# Patient Record
Sex: Female | Born: 1935
Health system: Southern US, Community
[De-identification: ages and names within clinical notes are randomized; demographics above are authoritative.]

## PROBLEM LIST (undated history)

## (undated) DIAGNOSIS — Z87442 Personal history of urinary calculi: Secondary | ICD-10-CM

## (undated) DIAGNOSIS — C50919 Malignant neoplasm of unspecified site of unspecified female breast: Secondary | ICD-10-CM

## (undated) DIAGNOSIS — M1990 Unspecified osteoarthritis, unspecified site: Secondary | ICD-10-CM

## (undated) DIAGNOSIS — N200 Calculus of kidney: Secondary | ICD-10-CM

## (undated) HISTORY — PX: CATARACT EXTRACTION, BILATERAL: SHX1313

## (undated) HISTORY — PX: ABDOMINAL HYSTERECTOMY: SHX81

## (undated) HISTORY — PX: BREAST SURGERY: SHX581

## (undated) HISTORY — PX: TOTAL ABDOMINAL HYSTERECTOMY W/ BILATERAL SALPINGOOPHORECTOMY: SHX83

## (undated) HISTORY — DX: Calculus of kidney: N20.0

## (undated) HISTORY — PX: WRIST FRACTURE SURGERY: SHX121

## (undated) HISTORY — PX: KIDNEY STONE SURGERY: SHX686

## (undated) HISTORY — PX: APPENDECTOMY: SHX54

## (undated) HISTORY — DX: Malignant neoplasm of unspecified site of unspecified female breast: C50.919

---

## 2002-03-10 ENCOUNTER — Other Ambulatory Visit: Admission: RE | Admit: 2002-03-10 | Discharge: 2002-03-10 | Payer: Self-pay | Admitting: Obstetrics and Gynecology

## 2004-12-18 ENCOUNTER — Other Ambulatory Visit: Admission: RE | Admit: 2004-12-18 | Discharge: 2004-12-18 | Payer: Self-pay | Admitting: Family Medicine

## 2010-05-04 ENCOUNTER — Encounter: Payer: Self-pay | Admitting: Emergency Medicine

## 2010-05-04 ENCOUNTER — Ambulatory Visit (HOSPITAL_COMMUNITY): Admission: RE | Admit: 2010-05-04 | Discharge: 2010-05-04 | Payer: Self-pay | Admitting: Urology

## 2010-05-08 ENCOUNTER — Ambulatory Visit (HOSPITAL_COMMUNITY): Admission: RE | Admit: 2010-05-08 | Discharge: 2010-05-08 | Payer: Self-pay | Admitting: Urology

## 2010-11-05 ENCOUNTER — Encounter: Payer: Self-pay | Admitting: Family Medicine

## 2010-12-30 LAB — CBC
MCHC: 34 g/dL (ref 30.0–36.0)
RDW: 13.5 % (ref 11.5–15.5)

## 2010-12-30 LAB — URINE MICROSCOPIC-ADD ON

## 2010-12-30 LAB — DIFFERENTIAL
Basophils Absolute: 0.1 10*3/uL (ref 0.0–0.1)
Lymphocytes Relative: 6 % — ABNORMAL LOW (ref 12–46)
Lymphs Abs: 0.7 10*3/uL (ref 0.7–4.0)
Monocytes Absolute: 0.6 10*3/uL (ref 0.1–1.0)
Neutro Abs: 9.2 10*3/uL — ABNORMAL HIGH (ref 1.7–7.7)
Neutrophils Relative %: 87 % — ABNORMAL HIGH (ref 43–77)

## 2010-12-30 LAB — URINALYSIS, ROUTINE W REFLEX MICROSCOPIC
Glucose, UA: NEGATIVE mg/dL
Nitrite: NEGATIVE
Protein, ur: NEGATIVE mg/dL
Urobilinogen, UA: 0.2 mg/dL (ref 0.0–1.0)

## 2010-12-30 LAB — URINE CULTURE: Culture: NO GROWTH

## 2010-12-30 LAB — COMPREHENSIVE METABOLIC PANEL
Albumin: 3.9 g/dL (ref 3.5–5.2)
BUN: 16 mg/dL (ref 6–23)
CO2: 27 mEq/L (ref 19–32)
GFR calc Af Amer: >60 mL/min (ref >60)
Glucose, Bld: 121 mg/dL — ABNORMAL HIGH (ref 70–99)
Sodium: 137 mEq/L (ref 135–145)

## 2010-12-30 LAB — SURGICAL PCR SCREEN
MRSA, PCR: NEGATIVE
Staphylococcus aureus: NEGATIVE

## 2016-01-14 DIAGNOSIS — C50919 Malignant neoplasm of unspecified site of unspecified female breast: Secondary | ICD-10-CM

## 2016-01-14 HISTORY — DX: Malignant neoplasm of unspecified site of unspecified female breast: C50.919

## 2016-01-16 DIAGNOSIS — L570 Actinic keratosis: Secondary | ICD-10-CM | POA: Diagnosis not present

## 2016-01-16 DIAGNOSIS — L989 Disorder of the skin and subcutaneous tissue, unspecified: Secondary | ICD-10-CM | POA: Diagnosis not present

## 2016-01-16 DIAGNOSIS — D485 Neoplasm of uncertain behavior of skin: Secondary | ICD-10-CM | POA: Diagnosis not present

## 2016-01-23 ENCOUNTER — Encounter: Payer: Self-pay | Admitting: Oncology

## 2016-01-23 ENCOUNTER — Telehealth: Payer: Self-pay | Admitting: Oncology

## 2016-01-23 NOTE — Telephone Encounter (Signed)
Spoke with patient re new patient appointment with GM 02/02/2016 @ 4 pm to arrive 3:30 pm. Patient demographic and insurance info confirmed. Letters sent.

## 2016-02-01 ENCOUNTER — Other Ambulatory Visit: Payer: Self-pay

## 2016-02-02 ENCOUNTER — Ambulatory Visit (HOSPITAL_BASED_OUTPATIENT_CLINIC_OR_DEPARTMENT_OTHER): Payer: Medicare Other | Admitting: Oncology

## 2016-02-02 ENCOUNTER — Other Ambulatory Visit (HOSPITAL_BASED_OUTPATIENT_CLINIC_OR_DEPARTMENT_OTHER): Payer: Medicare Other

## 2016-02-02 ENCOUNTER — Encounter: Payer: Self-pay | Admitting: Oncology

## 2016-02-02 ENCOUNTER — Other Ambulatory Visit: Payer: Self-pay

## 2016-02-02 VITALS — BP 144/84 | HR 80 | Temp 97.8°F | Resp 18 | Ht 62.5 in | Wt 162.3 lb

## 2016-02-02 DIAGNOSIS — Z170 Estrogen receptor positive status [ER+]: Secondary | ICD-10-CM

## 2016-02-02 DIAGNOSIS — C50919 Malignant neoplasm of unspecified site of unspecified female breast: Secondary | ICD-10-CM

## 2016-02-02 DIAGNOSIS — C50911 Malignant neoplasm of unspecified site of right female breast: Secondary | ICD-10-CM | POA: Diagnosis not present

## 2016-02-02 DIAGNOSIS — C50311 Malignant neoplasm of lower-inner quadrant of right female breast: Secondary | ICD-10-CM | POA: Insufficient documentation

## 2016-02-02 LAB — CBC WITH DIFFERENTIAL/PLATELET
BASO%: 0.9 % (ref 0.0–2.0)
BASOS ABS: 0.1 10*3/uL (ref 0.0–0.1)
EOS ABS: 0.1 10*3/uL (ref 0.0–0.5)
EOS%: 1.2 % (ref 0.0–7.0)
HEMATOCRIT: 45.3 % (ref 34.8–46.6)
HGB: 14.7 g/dL (ref 11.6–15.9)
LYMPH#: 1.3 10*3/uL (ref 0.9–3.3)
LYMPH%: 16.3 % (ref 14.0–49.7)
MCH: 28.3 pg (ref 25.1–34.0)
MCHC: 32.5 g/dL (ref 31.5–36.0)
MCV: 87.1 fL (ref 79.5–101.0)
MONO#: 0.5 10*3/uL (ref 0.1–0.9)
MONO%: 6.1 % (ref 0.0–14.0)
NEUT#: 5.9 10*3/uL (ref 1.5–6.5)
NEUT%: 75.5 % (ref 38.4–76.8)
PLATELETS: 219 10*3/uL (ref 145–400)
RBC: 5.2 10*6/uL (ref 3.70–5.45)
RDW: 13.6 % (ref 11.2–14.5)
WBC: 7.8 10*3/uL (ref 3.9–10.3)

## 2016-02-02 LAB — COMPREHENSIVE METABOLIC PANEL
ALT: 16 U/L (ref 0–55)
ANION GAP: 10 meq/L (ref 3–11)
AST: 17 U/L (ref 5–34)
Albumin: 3.8 g/dL (ref 3.5–5.0)
Alkaline Phosphatase: 105 U/L (ref 40–150)
BILIRUBIN TOTAL: 0.32 mg/dL (ref 0.20–1.20)
BUN: 18.2 mg/dL (ref 7.0–26.0)
CALCIUM: 9.4 mg/dL (ref 8.4–10.4)
CHLORIDE: 105 meq/L (ref 98–109)
CO2: 28 mEq/L (ref 22–29)
CREATININE: 0.9 mg/dL (ref 0.6–1.1)
EGFR: 63 mL/min/{1.73_m2} — ABNORMAL LOW (ref >90)
Glucose: 99 mg/dl (ref 70–140)
Potassium: 3.9 mEq/L (ref 3.5–5.1)
Sodium: 143 mEq/L (ref 136–145)
Total Protein: 7.6 g/dL (ref 6.4–8.3)

## 2016-02-02 LAB — LACTATE DEHYDROGENASE: LDH: 189 U/L (ref 125–245)

## 2016-02-02 MED ORDER — ANASTROZOLE 1 MG PO TABS: 1.0000 mg | ORAL_TABLET | Freq: Every day | ORAL | Status: DC

## 2016-02-02 NOTE — Progress Notes (Signed)
Stable to improved status was results and this is Coumadin adjusted shock [facility]  [phone] [phone]     ID: Jessica [name] DOB: 05/10/1935  MR#: [id number]  CSN#:[id number]  Patient Care Team: Gustav C Magrinat, MD as Consulting Physician (Oncology) Stuart Tafeen, MD as Consulting Physician (Dermatology) PCP: No primary care provider on file. GYN: SU:  OTHER MD:  CHIEF COMPLAINT: right-sided breast cancer  CURRENT TREATMENT: staging evaluation in progress   BREAST CANCER HISTORY: "Jessica [name]" was cutting some roses in her garden for someone's birthday when she scratched herself. In the process of dealing with that she discovered a spot under her right breast. She called [facility] and was evaluated by Dr. Tafeen 01/16/2016. He noted among other lesions of benign nature and area under the right breast which appeared inflamed and indented. Biopsy was obtained and showed ([id number]) metastatic carcinoma, which was estrogen and progesterone receptor positive. The lesion extended to the margins of the biopsy.  The patient's subsequent history is as detailed below.  INTERVAL HISTORY: Jessica [name] was evaluated in the breast clinic 02/02/2016 accompanied for the last part of her visit by her son Jessica [name].  REVIEW OF SYSTEMS:  The patient denies unusual headaches, visual changes, nausea, vomiting, dizziness, gait imbalance, or falls. She denies cough, phlegm production, pleurisy, or shortness of breath. She denies any heart problems, chest pain or pressure. She denies any change in bowel or bladder habits. She is not aware of any other skin areas or breast areas of concern. She denies pain, unexplained fatigue, unexplained weight loss, fever, rash, or bleeding. She walks about 30 minutes a day and gardens for exercise. A detailed review of systems today was otherwise negative.  PAST MEDICAL HISTORY: Past Medical History  Diagnosis Date  . Breast  cancer (HCC)   . Nephrolithiasis     PAST SURGICAL HISTORY: Past Surgical History  Procedure Laterality Date  . Total abdominal hysterectomy w/ bilateral salpingoophorectomy    . Appendectomy    . Cataract extraction, bilateral      FAMILY HISTORY No family history on file.  The patient's father died at the age of 68 from a heart attack in the setting of tobacco abuse. The patient's mother died at the age of 54 from suicide. The patient had 6 brothers, 5 sisters. 5 brothers are dead. 2 committed suicide. One was shot. One had an automobile accident, and one died in a house fire. Of the patient's 5 sisters one died with Lyme disease. There is no breast or ovarian cancer in the family to the patient's knowledge  GYNECOLOGIC HISTORY:  No LMP recorded.  menarche age 15 first live birth age 21 the patient is GX P3. She underwent hysterectomy with bilateral salpingo-oophorectomy about 50 years ago" and took hormone replacement "about 20 years".    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 Her  Husband Jessica [name] died 27 years ago from lung cancer and complications of smoking and alcohol. The patient lives alone with her dachshund.  Her children are Jessica [name], lives in [place] and is retired from long fourth [employer]; Jessica [name] lives in [place] and is retired from working in a body shop, and Jessica [name], who lives in [place] and is retired from a body shop. The patient has 3 granddaughters and 3 great-granddaughters. She is not a church attender.    ADVANCED DIRECTIVES:  Not in place. At the visit 02/02/2016 the patient was given advanced directive forms   to complete and notarize at her discretion   HEALTH MAINTENANCE: Social History  Substance Use Topics  . Smoking status: Former Smoker    Quit date: 02/02/2004  . Smokeless tobacco: Not on file  . Alcohol Use: No     Colonoscopy:  never  PAP:  Status post hysterectomy  Bone density:  never  Lipid panel:  Allergies  not on file  Current Outpatient Prescriptions  Medication Sig Dispense Refill  . anastrozole (ARIMIDEX) 1 MG tablet Take 1 tablet (1 mg total) by mouth daily. 90 tablet 4   No current facility-administered medications for this visit.    OBJECTIVE:  Older white woman in no acute distress Filed Vitals:   02/02/16 1558  BP: 144/84  Pulse: 80  Temp: 97.8 F (36.6 C)  Resp: 18     Body mass index is 29.19 kg/(m^2).    ECOG FS:0 - Asymptomatic  Ocular: Sclerae unicteric,  EOMs intact Ear-nose-throat: Oropharynx clear and moist Lymphatic: No cervical or supraclavicular adenopathy Lungs no rales or rhonchi, good excursion bilaterally Heart regular rate and rhythm, no murmur appreciated Abd soft, nontender, positive bowel sounds MSK no focal spinal tenderness, no joint edema Neuro: non-focal, well-oriented, appropriate affect Breasts:  The right breast itself is unremarkable except for a small scar in the inframammary fold which is imaged below. The Right axilla is benign. The left breast is unremarkable  Right breast 02/02/2016    LAB RESULTS:  CMP     Component Value Date/Time   NA 143 02/02/2016 1544   NA 137 05/04/2010 0846   K 3.9 02/02/2016 1544   K 3.8 05/04/2010 0846   CL 102 05/04/2010 0846   CO2 28 02/02/2016 1544   CO2 27 05/04/2010 0846   GLUCOSE 99 02/02/2016 1544   GLUCOSE 121* 05/04/2010 0846   BUN 18.2 02/02/2016 1544   BUN 16 05/04/2010 0846   CREATININE 0.9 02/02/2016 1544   CREATININE 0.80 05/04/2010 0846   CALCIUM 9.4 02/02/2016 1544   CALCIUM 9.1 05/04/2010 0846   PROT 7.6 02/02/2016 1544   PROT 7.0 05/04/2010 0846   ALBUMIN 3.8 02/02/2016 1544   ALBUMIN 3.9 05/04/2010 0846   AST 17 02/02/2016 1544   AST 19 05/04/2010 0846   ALT 16 02/02/2016 1544   ALT 17 05/04/2010 0846   ALKPHOS 105 02/02/2016 1544   ALKPHOS 100 05/04/2010 0846   BILITOT 0.32 02/02/2016 1544   BILITOT 0.5 05/04/2010 0846   GFRNONAA >60 05/04/2010 0846   GFRAA   05/04/2010 0846    >60        The eGFR has been calculated using the MDRD equation. This calculation has not been validated in all clinical situations. eGFR's persistently <60 mL/min signify possible Chronic Kidney Disease.    INo results found for: SPEP, UPEP  Lab Results  Component Value Date   WBC 7.8 02/02/2016   NEUTROABS 5.9 02/02/2016   HGB 14.7 02/02/2016   HCT 45.3 02/02/2016   MCV 87.1 02/02/2016   PLT 219 02/02/2016      Chemistry      Component Value Date/Time   NA 143 02/02/2016 1544   NA 137 05/04/2010 0846   K 3.9 02/02/2016 1544   K 3.8 05/04/2010 0846   CL 102 05/04/2010 0846   CO2 28 02/02/2016 1544   CO2 27 05/04/2010 0846   BUN 18.2 02/02/2016 1544   BUN 16 05/04/2010 0846   CREATININE 0.9 02/02/2016 1544   CREATININE 0.80 05/04/2010 0846  Component Value Date/Time   CALCIUM 9.4 02/02/2016 1544   CALCIUM 9.1 05/04/2010 0846   ALKPHOS 105 02/02/2016 1544   ALKPHOS 100 05/04/2010 0846   AST 17 02/02/2016 1544   AST 19 05/04/2010 0846   ALT 16 02/02/2016 1544   ALT 17 05/04/2010 0846   BILITOT 0.32 02/02/2016 1544   BILITOT 0.5 05/04/2010 0846       No results found for: LABCA2  No components found for: LABCA125  No results for input(s): INR in the last 168 hours.  Urinalysis    Component Value Date/Time   COLORURINE YELLOW 05/04/2010 0846   APPEARANCEUR CLOUDY* 05/04/2010 0846   LABSPEC 1.026 05/04/2010 0846   PHURINE 5.0 05/04/2010 0846   GLUCOSEU NEGATIVE 05/04/2010 0846   HGBUR LARGE* 05/04/2010 0846   BILIRUBINUR SMALL* 05/04/2010 0846   KETONESUR 15* 05/04/2010 0846   PROTEINUR NEGATIVE 05/04/2010 0846   UROBILINOGEN 0.2 05/04/2010 0846   NITRITE NEGATIVE 05/04/2010 0846   LEUKOCYTESUR TRACE* 05/04/2010 0846      ELIGIBLE FOR AVAILABLE RESEARCH PROTOCOL: no  STUDIES: No results found.  ASSESSMENT: 80 y.o. [place], [place] woman evaluated by dermatology with a shave biopsy of a lesion under the right breast  01/16/2016 showing carcinoma, estrogen and progesterone receptor positive, clinically T1c NX.  (1) anastrozole started 02/02/2016  (2) definitive surgery pending  (3) role of Oncotype, chemotherapy, adjuvant radiation, and other studies pending  PLAN: We spent the better part of today's hour-long appointment discussing the biology of breast cancer in general, and the specifics of the patient's tumor in particular.  Jessica [name]  Understands she has invasive breast cancer. I do not have enough information to know if this is ductal or lobular. We know that it is "estrogen eating", but we do not know if it is HER-2 eating. We also do not know whether it is confined to the breast or whether it has already spread.  The pathology report describes dermal spread but not epidermal spread. Accordingly this is not a T4 lesion.  We discussed the treatment of breast cancer and she understands this is divided into local and systemic therapies. We discussed the fact that lumpectomy plus radiation is equivalent to mastectomy in terms of survival. We need to obtain mammography and ultrasonography to decide whether a lumpectomy is feasible. If it is not then she would need mastectomy. Otherwise our recommendation would be for lumpectomy to be followed later by radiation.  In terms of systemic therapy she clearly will benefit from anti-estrogens. I do not know whether she would get any benefit from anti-HER-2 treatment as that test has not yet been Neurontin. The benefits of chemotherapy in this patient may be very marginal. We will consider an Oncotype or Mammaprint to help us clarify that possible benefit.  At this point I am setting her up for a CT of the chest and a bone scan and also for bilateral diagnostic mammography with tomography and right breast ultrasonography. I have placed a surgical referral.   I am also starting her on anastrozole. We discussed the possible toxicities, side effects and complications of  this agent and the prescription was placed to her pharmacy. She should start it today and calls if she has any side effects from it.  She will see us again the first week in May just to make sure everything is moving appropriately and that she is not having any unusual side effects from the anastrozole.  Finally I gave the patient a healthcare part of attorney   form for her to complete at her discretion. If she has trouble getting and notarize we will do that for her at the next visit.  Jessica [name] has a good understanding of the overall plan. She knows the goal of treatment in her case is cure. She will call with any problems that may develop before her next visit here.  MAGRINAT,GUSTAV C, MD   02/02/2016 6:03 PM Medical Oncology and Hematology [facility] [address] [place], [place] [address] Tel. [phone]    Fax. [phone]    

## 2016-02-03 ENCOUNTER — Telehealth: Payer: Self-pay | Admitting: Oncology

## 2016-02-03 LAB — CANCER ANTIGEN 27.29: CAN 27.29: 28.3 U/mL (ref 0.0–38.6)

## 2016-02-03 NOTE — Telephone Encounter (Signed)
appt with [facility] Dr Ninfa Linden May 3 at 26 am [place]

## 2016-02-03 NOTE — Telephone Encounter (Signed)
appt made per 4/20 pof. Mammo/Us appt with [facility] May 9 at 1045 am

## 2016-02-03 NOTE — Telephone Encounter (Signed)
lvm to inform pt of all appt date/times

## 2016-02-10 ENCOUNTER — Encounter: Payer: Self-pay | Admitting: *Deleted

## 2016-02-10 ENCOUNTER — Ambulatory Visit (HOSPITAL_COMMUNITY)
Admission: RE | Admit: 2016-02-10 | Discharge: 2016-02-10 | Disposition: A | Discharge status: Home / Self Care | Payer: Medicare Other | Source: Ambulatory Visit | Attending: Oncology | Admitting: Oncology

## 2016-02-10 DIAGNOSIS — C50911 Malignant neoplasm of unspecified site of right female breast: Secondary | ICD-10-CM

## 2016-02-10 DIAGNOSIS — I2510 Atherosclerotic heart disease of native coronary artery without angina pectoris: Secondary | ICD-10-CM | POA: Diagnosis not present

## 2016-02-10 MED ORDER — IOPAMIDOL (ISOVUE-300) INJECTION 61%
75.0000 mL | Freq: Once | INTRAVENOUS | Status: AC | PRN
  Administered 2016-02-10: 75 mL via INTRAVENOUS

## 2016-02-10 MED ORDER — TECHNETIUM TC 99M MEDRONATE IV KIT
25.7000 | PACK | Freq: Once | INTRAVENOUS | Status: AC | PRN
  Administered 2016-02-10: 25.7 via INTRAVENOUS

## 2016-02-13 ENCOUNTER — Other Ambulatory Visit: Payer: Self-pay | Admitting: Oncology

## 2016-02-14 ENCOUNTER — Other Ambulatory Visit: Payer: Self-pay | Admitting: Radiology

## 2016-02-14 DIAGNOSIS — C50911 Malignant neoplasm of unspecified site of right female breast: Secondary | ICD-10-CM

## 2016-02-14 DIAGNOSIS — N63 Unspecified lump in breast: Secondary | ICD-10-CM | POA: Diagnosis not present

## 2016-02-15 ENCOUNTER — Other Ambulatory Visit: Payer: Self-pay | Admitting: Oncology

## 2016-02-15 DIAGNOSIS — C50911 Malignant neoplasm of unspecified site of right female breast: Secondary | ICD-10-CM | POA: Diagnosis not present

## 2016-02-16 ENCOUNTER — Ambulatory Visit
Admission: RE | Admit: 2016-02-16 | Discharge: 2016-02-16 | Disposition: A | Discharge status: Home / Self Care | Payer: Medicare Other | Source: Ambulatory Visit | Attending: Radiology | Admitting: Radiology

## 2016-02-16 DIAGNOSIS — C50311 Malignant neoplasm of lower-inner quadrant of right female breast: Secondary | ICD-10-CM | POA: Diagnosis not present

## 2016-02-16 DIAGNOSIS — C50911 Malignant neoplasm of unspecified site of right female breast: Secondary | ICD-10-CM

## 2016-02-16 MED ORDER — GADOBENATE DIMEGLUMINE 529 MG/ML IV SOLN
15.0000 mL | Freq: Once | INTRAVENOUS | Status: AC | PRN
  Administered 2016-02-16: 15 mL via INTRAVENOUS

## 2016-02-22 ENCOUNTER — Other Ambulatory Visit: Payer: Self-pay | Admitting: Radiology

## 2016-02-22 DIAGNOSIS — N63 Unspecified lump in breast: Secondary | ICD-10-CM | POA: Diagnosis not present

## 2016-02-22 DIAGNOSIS — N6091 Unspecified benign mammary dysplasia of right breast: Secondary | ICD-10-CM | POA: Diagnosis not present

## 2016-02-23 ENCOUNTER — Other Ambulatory Visit: Payer: Self-pay | Admitting: Oncology

## 2016-02-24 ENCOUNTER — Other Ambulatory Visit: Payer: Self-pay | Admitting: Oncology

## 2016-02-27 ENCOUNTER — Telehealth: Payer: Self-pay | Admitting: *Deleted

## 2016-02-27 NOTE — Telephone Encounter (Signed)
Pt called asking "do I still need to take that cancer pill Dr. Jana Hakim gave me". Relate "Dr. Isaiah Blakes told me I don't have cancer".  Discussed with pt that her original bx showed cancer, her additional bx from a different location did not show cancer. Pt with some confusion. Discussed imaging and locations of 2 bx sites and dx from each bx. Informed pt that I will reach out to [facility] to have the radiologist clarify with her the 2 locations of bx and dx of each. Pt appreciative. [employer] and spoke to coordinator. Will have radiologist contact pt to discuss the above.

## 2016-03-02 ENCOUNTER — Other Ambulatory Visit: Payer: Self-pay | Admitting: Surgery

## 2016-03-02 DIAGNOSIS — N6091 Unspecified benign mammary dysplasia of right breast: Secondary | ICD-10-CM

## 2016-03-02 DIAGNOSIS — C50911 Malignant neoplasm of unspecified site of right female breast: Secondary | ICD-10-CM | POA: Diagnosis not present

## 2016-03-05 ENCOUNTER — Other Ambulatory Visit: Payer: Self-pay | Admitting: Oncology

## 2016-03-06 ENCOUNTER — Other Ambulatory Visit: Payer: Self-pay | Admitting: Surgery

## 2016-03-06 DIAGNOSIS — C50911 Malignant neoplasm of unspecified site of right female breast: Secondary | ICD-10-CM

## 2016-03-08 ENCOUNTER — Telehealth: Payer: Self-pay | Admitting: Oncology

## 2016-03-08 ENCOUNTER — Other Ambulatory Visit: Payer: Self-pay | Admitting: *Deleted

## 2016-03-08 NOTE — Telephone Encounter (Signed)
Spoke with patient to confirm appt 5/26 change to 6/27 per 5/25 pof

## 2016-03-09 ENCOUNTER — Ambulatory Visit: Payer: Medicare Other | Admitting: Oncology

## 2016-03-14 ENCOUNTER — Encounter (HOSPITAL_BASED_OUTPATIENT_CLINIC_OR_DEPARTMENT_OTHER): Payer: Self-pay | Admitting: *Deleted

## 2016-03-19 DIAGNOSIS — C50911 Malignant neoplasm of unspecified site of right female breast: Secondary | ICD-10-CM | POA: Diagnosis not present

## 2016-03-19 NOTE — H&P (Signed)
Jessica [name]  Location: [facility] Patient #: [id number] DOB: Dec 28, 1934 Widowed / Language: Cleophus Molt / Race: Undefined Female   History of Present Illness  Patient words: breast eval.  The patient is a 81 year old female who presents with breast cancer. This is a very pleasant female referred by Dr. Gunnar Bulla Magrinat after the recent diagnosis of a right breast cancer. She actually noticed a small lesion on the skin at the inframammary ridge medially on the right breast following doing yardwork. She saw a dermatologist and a biopsy was performed showing an invasive breast cancer. She has since had an ultrasound and mammograms. There is a 2.5 cm irregular right breast mass in the lower inner quadrant which may be invading the underlying muscle based on the recent studies. It is hormonal positive and she has already been placed on medications by Dr. Jana Hakim. She is scheduled to have an MRI of her breast tomorrow. She has no previous problems with her breasts. She is otherwise been doing very well and has no complaints. She denies nipple discharge.    Other Problems No pertinent past medical history  Past Surgical History  Breast Biopsy Left. Cataract Surgery Left. Hemorrhoidectomy Hysterectomy (not due to cancer) - Partial  Diagnostic Studies History Colonoscopy never Mammogram within last year Pap Smear >5 years ago  Allergies  No Known Drug Allergies05/12/2015  Medication History  Anastrozole (1MG  Tablet, Oral) Active. Medications Reconciled  Social History Caffeine use Coffee, Tea. Tobacco use Former smoker.  Family History  First Degree Relatives No pertinent family history    Review of Systems  Skin Not Present- Change in Wart/Mole, Dryness, Hives, Jaundice, New Lesions, Non-Healing Wounds, Rash and Ulcer. HEENT Not Present- Earache, Hearing Loss, Hoarseness, Nose Bleed, Oral Ulcers, Ringing in the Ears, Seasonal Allergies, Sinus  Pain, Sore Throat, Visual Disturbances, Wears glasses/contact lenses and Yellow Eyes. Breast Not Present- Breast Mass, Breast Pain, Nipple Discharge and Skin Changes. Cardiovascular Not Present- Chest Pain, Difficulty Breathing Lying Down, Leg Cramps, Palpitations, Rapid Heart Rate, Shortness of Breath and Swelling of Extremities. Gastrointestinal Not Present- Abdominal Pain, Bloating, Bloody Stool, Change in Bowel Habits, Chronic diarrhea, Constipation, Difficulty Swallowing, Excessive gas, Gets full quickly at meals, Hemorrhoids, Indigestion, Nausea, Rectal Pain and Vomiting. Female Genitourinary Not Present- Frequency, Nocturia, Painful Urination, Pelvic Pain and Urgency. Musculoskeletal Not Present- Back Pain, Joint Pain, Joint Stiffness, Muscle Pain, Muscle Weakness and Swelling of Extremities. Neurological Not Present- Decreased Memory, Fainting, Headaches, Numbness, Seizures, Tingling, Tremor, Trouble walking and Weakness. Endocrine Not Present- Cold Intolerance, Excessive Hunger, Hair Changes, Heat Intolerance, Hot flashes and New Diabetes.  Vitals   Weight: 162 lb Height: 62in Body Surface Area: 1.75 m Body Mass Index: 29.63 kg/m  Temp.: 82F(Temporal)  Pulse: 77 (Regular)  BP: 130/76 (Sitting, Left Arm, Standard)   Physical Exam  General Mental Status-Alert. General Appearance-Consistent with stated age. Hydration-Well hydrated. Voice-Normal.  Head and Neck Head-normocephalic, atraumatic with no lesions or palpable masses. Trachea-midline. Thyroid Gland Characteristics - normal size and consistency.  Eye Eyeball - Bilateral-Extraocular movements intact. Sclera/Conjunctiva - Bilateral-No scleral icterus.  Chest and Lung Exam Chest and lung exam reveals -quiet, even and easy respiratory effort with no use of accessory muscles and on auscultation, normal breath sounds, no adventitious sounds and normal vocal resonance. Inspection Chest Wall  - Normal. Back - normal.  Breast Breast - Left-Symmetric and Lumpectomy scar, Non Tender, No Biopsy scars, no Dimpling, No Inflammation, No Mastectomy scars, No Peau d' Orange. Breast - Right-Symmetric and  Dimpling, Non Tender, No Biopsy scars, No Inflammation, No Lumpectomy scars, No Mastectomy scars, No Peau d' Orange. Note: The superficial lesion with skin dimpling is apparent at the medial aspect of the lower inner quadrant along the inframammary ridge. There are no other breast masses on either side and no axillary or supraclavicular adenopathy Breast Lump-No Palpable Breast Mass.  Cardiovascular Cardiovascular examination reveals -normal heart sounds, regular rate and rhythm with no murmurs and normal pedal pulses bilaterally.  Abdomen Inspection Inspection of the abdomen reveals - No Hernias. Skin - Scar - no surgical scars. Palpation/Percussion Palpation and Percussion of the abdomen reveal - Soft, Non Tender, No Rebound tenderness, No Rigidity (guarding) and No hepatosplenomegaly. Auscultation Auscultation of the abdomen reveals - Bowel sounds normal.  Neurologic Neurologic evaluation reveals -alert and oriented x 3 with no impairment of recent or remote memory. Mental Status-Normal.  Musculoskeletal Normal Exam - Left-Upper Extremity Strength Normal and Lower Extremity Strength Normal. Normal Exam - Right-Upper Extremity Strength Normal and Lower Extremity Strength Normal.  Lymphatic Head & Neck  General Head & Neck Lymphatics: Bilateral - Description - Normal. Axillary  General Axillary Region: Bilateral - Description - Normal. Tenderness - Non Tender. Femoral & Inguinal - Did not examine.    Assessment & Plan   BREAST CANCER, RIGHT (C50.911) Impression: Right breast cancer. Her bone scan and CT of the chest are otherwise unremarkable. She will be getting an MRI tomorrow and this will help show the exact size and whether there is chest wall  invasion. I suspect that I would still be able to resect this locally without the need for a mastectomy. I briefly discussed this with her. Depending on the size, she may need to continue and anti-hormonal therapy to see if this will shrink the tumor down prior surgery. I'll discuss this with her further as well as oncology after the MRI and we can determine how to proceed  Addendum:  The patient is a 81 year old female who presents with breast cancer. She is here today again regarding her right breast cancer and to discuss options. She has had a second area biopsied in her breast. This showed atypical lobular hyperplasia with sclerosis. She is currently on anti-hormonal therapy. We are discussing whether to go ahead and proceed with surgery or continue anti-hormonal therapy to shrink the mass down further. The MRI shows that the cancer is 2.8 x 1.7 x 1 cm. It extends to the skin and is visible and abuts the pectoralis muscle without direct invasion. There are no enlarged lymph nodes  Impression: I again discussed a partial mastectomy of the right lower inner quadrant of the breast as well as a radioactive seed lumpectomy for the atypical lesion versus shrinking it down further with anti-hormonal therapy for 3-6 months. I believe either option is reasonable. She just wants to go ahead and proceed with surgery. I discussed the risks of surgery with her. These include but are not limited to bleeding, infection, need for further surgery if margins are positive, cardiopulmonary issues, DVT, postoperative recovery, etc. She understands and wishes to proceed with surgery which will be scheduled

## 2016-03-19 NOTE — Progress Notes (Signed)
Pt given 8 oz box of wildberry boost freeze with instructions to drink by 8 am morning of surgery. Instructed this is the only liquid to drink after midnight. Pt voiced understanding of written and verbal instructions she was given teach back done.

## 2016-03-20 ENCOUNTER — Encounter (HOSPITAL_BASED_OUTPATIENT_CLINIC_OR_DEPARTMENT_OTHER)
Admission: RE | Disposition: A | Discharge status: Home / Self Care | Payer: Self-pay | Source: Ambulatory Visit | Attending: Surgery

## 2016-03-20 ENCOUNTER — Encounter (HOSPITAL_BASED_OUTPATIENT_CLINIC_OR_DEPARTMENT_OTHER): Payer: Self-pay | Admitting: Anesthesiology

## 2016-03-20 ENCOUNTER — Ambulatory Visit (HOSPITAL_BASED_OUTPATIENT_CLINIC_OR_DEPARTMENT_OTHER): Payer: Medicare Other | Admitting: Anesthesiology

## 2016-03-20 ENCOUNTER — Ambulatory Visit (HOSPITAL_BASED_OUTPATIENT_CLINIC_OR_DEPARTMENT_OTHER)
Admission: RE | Admit: 2016-03-20 | Discharge: 2016-03-20 | Disposition: A | Discharge status: Home / Self Care | Payer: Medicare Other | Source: Ambulatory Visit | Attending: Surgery | Admitting: Surgery

## 2016-03-20 DIAGNOSIS — N6011 Diffuse cystic mastopathy of right breast: Secondary | ICD-10-CM | POA: Diagnosis not present

## 2016-03-20 DIAGNOSIS — Z853 Personal history of malignant neoplasm of breast: Secondary | ICD-10-CM | POA: Diagnosis not present

## 2016-03-20 DIAGNOSIS — C50911 Malignant neoplasm of unspecified site of right female breast: Secondary | ICD-10-CM | POA: Diagnosis not present

## 2016-03-20 DIAGNOSIS — Z87891 Personal history of nicotine dependence: Secondary | ICD-10-CM | POA: Insufficient documentation

## 2016-03-20 DIAGNOSIS — N6081 Other benign mammary dysplasias of right breast: Secondary | ICD-10-CM | POA: Diagnosis not present

## 2016-03-20 HISTORY — PX: BREAST LUMPECTOMY WITH RADIOACTIVE SEED LOCALIZATION: SHX6424

## 2016-03-20 SURGERY — BREAST LUMPECTOMY WITH RADIOACTIVE SEED LOCALIZATION
Anesthesia: General | Site: Breast | Laterality: Right

## 2016-03-20 MED ORDER — FENTANYL CITRATE (PF) 100 MCG/2ML IJ SOLN
INTRAMUSCULAR | Status: AC
  Filled 2016-03-20: qty 2

## 2016-03-20 MED ORDER — EPHEDRINE SULFATE 50 MG/ML IJ SOLN
INTRAMUSCULAR | Status: DC | PRN
  Administered 2016-03-20: 10 mg via INTRAVENOUS

## 2016-03-20 MED ORDER — DEXAMETHASONE SODIUM PHOSPHATE 4 MG/ML IJ SOLN
INTRAMUSCULAR | Status: DC | PRN
  Administered 2016-03-20: 10 mg via INTRAVENOUS

## 2016-03-20 MED ORDER — ONDANSETRON HCL 4 MG/2ML IJ SOLN
INTRAMUSCULAR | Status: AC
  Filled 2016-03-20: qty 2

## 2016-03-20 MED ORDER — LACTATED RINGERS IV SOLN
INTRAVENOUS | Status: DC
  Administered 2016-03-20 (×2): via INTRAVENOUS

## 2016-03-20 MED ORDER — LIDOCAINE HCL (CARDIAC) 20 MG/ML IV SOLN
INTRAVENOUS | Status: DC | PRN
  Administered 2016-03-20: 60 mg via INTRAVENOUS

## 2016-03-20 MED ORDER — LIDOCAINE 2% (20 MG/ML) 5 ML SYRINGE
INTRAMUSCULAR | Status: AC
  Filled 2016-03-20: qty 5

## 2016-03-20 MED ORDER — CEFAZOLIN SODIUM-DEXTROSE 2-4 GM/100ML-% IV SOLN
INTRAVENOUS | Status: AC
  Filled 2016-03-20: qty 100

## 2016-03-20 MED ORDER — KETOROLAC TROMETHAMINE 30 MG/ML IJ SOLN: 15.0000 mg | Freq: Once | INTRAMUSCULAR | Status: DC | PRN

## 2016-03-20 MED ORDER — FENTANYL CITRATE (PF) 100 MCG/2ML IJ SOLN
50.0000 ug | INTRAMUSCULAR | Status: AC | PRN
  Administered 2016-03-20: 50 ug via INTRAVENOUS
  Administered 2016-03-20: 25 ug via INTRAVENOUS
  Administered 2016-03-20: 50 ug via INTRAVENOUS

## 2016-03-20 MED ORDER — MORPHINE SULFATE (PF) 2 MG/ML IV SOLN: 1.0000 mg | INTRAVENOUS | Status: DC | PRN

## 2016-03-20 MED ORDER — ONDANSETRON HCL 4 MG/2ML IJ SOLN
INTRAMUSCULAR | Status: DC | PRN
  Administered 2016-03-20: 4 mg via INTRAVENOUS

## 2016-03-20 MED ORDER — PROPOFOL 10 MG/ML IV BOLUS
INTRAVENOUS | Status: DC | PRN
  Administered 2016-03-20: 150 mg via INTRAVENOUS

## 2016-03-20 MED ORDER — BUPIVACAINE-EPINEPHRINE 0.5% -1:200000 IJ SOLN
INTRAMUSCULAR | Status: DC | PRN
  Administered 2016-03-20: 30 mL

## 2016-03-20 MED ORDER — SODIUM CHLORIDE 0.9% FLUSH: 3.0000 mL | Freq: Two times a day (BID) | INTRAVENOUS | Status: DC

## 2016-03-20 MED ORDER — MIDAZOLAM HCL 2 MG/2ML IJ SOLN: 1.0000 mg | INTRAMUSCULAR | Status: DC | PRN

## 2016-03-20 MED ORDER — OXYCODONE HCL 5 MG PO TABS: 5.0000 mg | ORAL_TABLET | Freq: Once | ORAL | Status: DC | PRN

## 2016-03-20 MED ORDER — SCOPOLAMINE 1 MG/3DAYS TD PT72: 1.0000 | MEDICATED_PATCH | Freq: Once | TRANSDERMAL | Status: DC | PRN

## 2016-03-20 MED ORDER — SODIUM CHLORIDE 0.9 % IV SOLN: 250.0000 mL | INTRAVENOUS | Status: DC | PRN

## 2016-03-20 MED ORDER — OXYCODONE HCL 5 MG/5ML PO SOLN: 5.0000 mg | Freq: Once | ORAL | Status: DC | PRN

## 2016-03-20 MED ORDER — DEXAMETHASONE SODIUM PHOSPHATE 10 MG/ML IJ SOLN
INTRAMUSCULAR | Status: AC
  Filled 2016-03-20: qty 1

## 2016-03-20 MED ORDER — CEFAZOLIN SODIUM-DEXTROSE 2-4 GM/100ML-% IV SOLN
2.0000 g | INTRAVENOUS | Status: AC
  Administered 2016-03-20: 2 g via INTRAVENOUS

## 2016-03-20 MED ORDER — PROPOFOL 10 MG/ML IV BOLUS
INTRAVENOUS | Status: AC
  Filled 2016-03-20: qty 20

## 2016-03-20 MED ORDER — ACETAMINOPHEN 325 MG PO TABS: 650.0000 mg | ORAL_TABLET | ORAL | Status: DC | PRN

## 2016-03-20 MED ORDER — HYDROMORPHONE HCL 1 MG/ML IJ SOLN: 0.2500 mg | INTRAMUSCULAR | Status: DC | PRN

## 2016-03-20 MED ORDER — BUPIVACAINE-EPINEPHRINE (PF) 0.5% -1:200000 IJ SOLN
INTRAMUSCULAR | Status: AC
  Filled 2016-03-20: qty 30

## 2016-03-20 MED ORDER — OXYCODONE HCL 5 MG PO TABS
5.0000 mg | ORAL_TABLET | ORAL | Status: DC | PRN
Start: 2016-03-20 — End: 2016-03-20

## 2016-03-20 MED ORDER — ACETAMINOPHEN 650 MG RE SUPP: 650.0000 mg | RECTAL | Status: DC | PRN

## 2016-03-20 MED ORDER — PROMETHAZINE HCL 25 MG/ML IJ SOLN: 6.2500 mg | INTRAMUSCULAR | Status: DC | PRN

## 2016-03-20 MED ORDER — HYDROCODONE-ACETAMINOPHEN 5-325 MG PO TABS: 1.0000 | ORAL_TABLET | ORAL | Status: DC | PRN

## 2016-03-20 MED ORDER — GLYCOPYRROLATE 0.2 MG/ML IJ SOLN: 0.2000 mg | Freq: Once | INTRAMUSCULAR | Status: DC | PRN

## 2016-03-20 MED ORDER — SODIUM CHLORIDE 0.9% FLUSH: 3.0000 mL | INTRAVENOUS | Status: DC | PRN

## 2016-03-20 SURGICAL SUPPLY — 38 items
APPLIER CLIP 9.375 MED OPEN (MISCELLANEOUS) ×2
APR CLP MED 9.3 20 MLT OPN (MISCELLANEOUS) ×1
BINDER BREAST LRG (GAUZE/BANDAGES/DRESSINGS) ×1 IMPLANT
BLADE HEX COATED 2.75 (ELECTRODE) ×2 IMPLANT
BLADE SURG 15 STRL LF DISP TIS (BLADE) ×1 IMPLANT
BLADE SURG 15 STRL SS (BLADE) ×2
CHLORAPREP W/TINT 26ML (MISCELLANEOUS) ×2 IMPLANT
CLIP APPLIE 9.375 MED OPEN (MISCELLANEOUS) IMPLANT
COVER BACK TABLE 60X90IN (DRAPES) ×2 IMPLANT
COVER MAYO STAND STRL (DRAPES) ×2 IMPLANT
COVER PROBE W GEL 5X96 (DRAPES) ×2 IMPLANT
DEVICE DUBIN W/COMP PLATE 8390 (MISCELLANEOUS) ×2 IMPLANT
DRAPE LAPAROTOMY 100X72 PEDS (DRAPES) ×2 IMPLANT
DRAPE UTILITY XL STRL (DRAPES) ×2 IMPLANT
ELECT REM PT RETURN 9FT ADLT (ELECTROSURGICAL) ×2
ELECTRODE REM PT RTRN 9FT ADLT (ELECTROSURGICAL) ×1 IMPLANT
GLOVE BIOGEL PI IND STRL 7.0 (GLOVE) IMPLANT
GLOVE BIOGEL PI INDICATOR 7.0 (GLOVE) ×3
GLOVE ECLIPSE 6.5 STRL STRAW (GLOVE) ×1 IMPLANT
GLOVE SURG SIGNA 7.5 PF LTX (GLOVE) ×2 IMPLANT
GLOVE SURG SS PI 6.5 STRL IVOR (GLOVE) ×1 IMPLANT
GOWN STRL REUS W/ TWL LRG LVL3 (GOWN DISPOSABLE) ×1 IMPLANT
GOWN STRL REUS W/ TWL XL LVL3 (GOWN DISPOSABLE) ×1 IMPLANT
GOWN STRL REUS W/TWL LRG LVL3 (GOWN DISPOSABLE) ×2
GOWN STRL REUS W/TWL XL LVL3 (GOWN DISPOSABLE) ×2
KIT MARKER MARGIN INK (KITS) ×2 IMPLANT
LIQUID BAND (GAUZE/BANDAGES/DRESSINGS) ×2 IMPLANT
NDL HYPO 25X1 1.5 SAFETY (NEEDLE) ×1 IMPLANT
NEEDLE HYPO 25X1 1.5 SAFETY (NEEDLE) ×2 IMPLANT
PACK BASIN DAY SURGERY FS (CUSTOM PROCEDURE TRAY) ×2 IMPLANT
PENCIL BUTTON HOLSTER BLD 10FT (ELECTRODE) ×2 IMPLANT
SLEEVE SCD COMPRESS KNEE MED (MISCELLANEOUS) ×2 IMPLANT
SPONGE LAP 4X18 X RAY DECT (DISPOSABLE) ×2 IMPLANT
SUT MNCRL AB 4-0 PS2 18 (SUTURE) ×2 IMPLANT
SUT VIC AB 3-0 SH 27 (SUTURE) ×2
SUT VIC AB 3-0 SH 27X BRD (SUTURE) ×1 IMPLANT
SYR CONTROL 10ML LL (SYRINGE) ×2 IMPLANT
TOWEL OR 17X24 6PK STRL BLUE (TOWEL DISPOSABLE) ×2 IMPLANT

## 2016-03-20 NOTE — Transfer of Care (Signed)
Immediate Anesthesia Transfer of Care Note  Patient: Jessica [name]  Procedure(s) Performed: Procedure(s): RIGHT BREAST LUMPECTOMY WITH RADIOACTIVE SEED LOCALIZATION, AND RIGHT BREAST PARTIAL MASTECTOMY (Right)  Patient Location: PACU  Anesthesia Type:General  Level of Consciousness: awake, alert  and patient cooperative  Airway & Oxygen Therapy: Patient Spontanous Breathing and Patient connected to face mask oxygen  Post-op Assessment: Report given to RN, Post -op Vital signs reviewed and stable and Patient moving all extremities  Post vital signs: Reviewed and stable  Last Vitals:  Filed Vitals:   03/20/16 1013  BP: 137/83  Pulse: 79  Temp: 36.6 C  Resp: 20    Last Pain: There were no vitals filed for this visit.       Complications: No apparent anesthesia complications

## 2016-03-20 NOTE — Op Note (Signed)
RIGHT BREAST LUMPECTOMY WITH RADIOACTIVE SEED LOCALIZATION, AND RIGHT BREAST PARTIAL MASTECTOMY  Procedure Note  Jessica [name] 03/20/2016   Pre-op Diagnosis: RIGHT BREAST CANCER, RIGHT BREAST ALH     Post-op Diagnosis: same  Procedure(s): RIGHT BREAST LUMPECTOMY WITH RADIOACTIVE SEED LOCALIZATION RIGHT BREAST PARTIAL MASTECTOMY  Surgeon(s): Coralie Keens, MD  Anesthesia: General  Staff:  Circulator: Izora Ribas, RN Relief Circulator: Eston Esters, RN Relief Scrub: Lynelle Doctor, RN Scrub Person: Faythe Dingwall, RN  Estimated Blood Loss: Minimal               Specimens: sent to path          [facility]   Date: 03/20/2016  Time: 1:13 PM

## 2016-03-20 NOTE — Anesthesia Procedure Notes (Signed)
Procedure Name: LMA Insertion Date/Time: 03/20/2016 12:17 PM Performed by: Tod Abrahamsen D Pre-anesthesia Checklist: Patient identified, Emergency Drugs available, Suction available and Patient being monitored Patient Re-evaluated:Patient Re-evaluated prior to inductionOxygen Delivery Method: Circle system utilized Preoxygenation: Pre-oxygenation with 100% oxygen Intubation Type: IV induction Ventilation: Mask ventilation without difficulty LMA: LMA inserted LMA Size: 4.0 Number of attempts: 1 Airway Equipment and Method: Bite block Placement Confirmation: positive ETCO2 Tube secured with: Tape Dental Injury: Teeth and Oropharynx as per pre-operative assessment

## 2016-03-20 NOTE — Discharge Instructions (Signed)
[place] Office Phone Number [phone]  BREAST BIOPSY/ PARTIAL MASTECTOMY: POST OP INSTRUCTIONS  Always review your discharge instruction sheet given to you by the facility where your surgery was performed.  IF YOU HAVE DISABILITY OR FAMILY LEAVE FORMS, YOU MUST BRING THEM TO THE OFFICE FOR PROCESSING.  DO NOT GIVE THEM TO YOUR DOCTOR.  1. A prescription for pain medication may be given to you upon discharge.  Take your pain medication as prescribed, if needed.  If narcotic pain medicine is not needed, then you may take acetaminophen (Tylenol) or ibuprofen (Advil) as needed. 2. Take your usually prescribed medications unless otherwise directed 3. If you need a refill on your pain medication, please contact your pharmacy.  They will contact our office to request authorization.  Prescriptions will not be filled after 5pm or on week-ends. 4. You should eat very light the first 24 hours after surgery, such as soup, crackers, pudding, etc.  Resume your normal diet the day after surgery. 5. Most patients will experience some swelling and bruising in the breast.  Ice packs and a good support bra will help.  Swelling and bruising can take several days to resolve.  6. It is common to experience some constipation if taking pain medication after surgery.  Increasing fluid intake and taking a stool softener will usually help or prevent this problem from occurring.  A mild laxative (Milk of Magnesia or Miralax) should be taken according to package directions if there are no bowel movements after 48 hours. 7. Unless discharge instructions indicate otherwise, you may remove your bandages 24-48 hours after surgery, and you may shower at that time.  You may have steri-strips (small skin tapes) in place directly over the incision.  These strips should be left on the skin for 7-10 days.  If your surgeon used skin glue on the incision, you may shower in 24 hours.  The glue will flake off over the  next 2-3 weeks.  Any sutures or staples will be removed at the office during your follow-up visit. 8. ACTIVITIES:  You may resume regular daily activities (gradually increasing) beginning the next day.  Wearing a good support bra or sports bra minimizes pain and swelling.  You may have sexual intercourse when it is comfortable. a. You may drive when you no longer are taking prescription pain medication, you can comfortably wear a seatbelt, and you can safely maneuver your car and apply brakes. b. RETURN TO WORK:  ______________________________________________________________________________________ 9. You should see your doctor in the office for a follow-up appointment approximately two weeks after your surgery.  Your doctors nurse will typically make your follow-up appointment when she calls you with your pathology report.  Expect your pathology report 2-3 business days after your surgery.  You may call to check if you do not hear from [place] after three days. 10. OTHER INSTRUCTIONS: ____OK TO REMOVE BINDER TOMORROW AND SHOWER.  YOU CAN STILL WEAR THE BINDER IF YOU WANT TO FOR COMFORT. 11. ICE PACK AND IBUPROFEN ALSO FOR PAIN___________________________________________________________________________________________ _____________________________________________________________________________________________________________________________________ _____________________________________________________________________________________________________________________________________ _____________________________________________________________________________________________________________________________________  WHEN TO CALL YOUR DOCTOR: 1. Fever over 101.0 2. Nausea and/or vomiting. 3. Extreme swelling or bruising. 4. Continued bleeding from incision. 5. Increased pain, redness, or drainage from the incision.  The clinic staff is available to answer your questions during regular business hours.  Please  dont hesitate to call and ask to speak to one of the nurses for clinical concerns.  If you have a medical emergency, go to the nearest  emergency room or call 911.  A surgeon from [facility] is always on call at the hospital.  For further questions, please visit [email or web address]     Post Anesthesia Home Care Instructions  Activity: Get plenty of rest for the remainder of the day. A responsible adult should stay with you for 24 hours following the procedure.  For the next 24 hours, DO NOT: -Drive a car -Paediatric nurse -Drink alcoholic beverages -Take any medication unless instructed by your physician -Make any legal decisions or sign important papers.  Meals: Start with liquid foods such as gelatin or soup. Progress to regular foods as tolerated. Avoid greasy, spicy, heavy foods. If nausea and/or vomiting occur, drink only clear liquids until the nausea and/or vomiting subsides. Call your physician if vomiting continues.  Special Instructions/Symptoms: Your throat may feel dry or sore from the anesthesia or the breathing tube placed in your throat during surgery. If this causes discomfort, gargle with warm salt water. The discomfort should disappear within 24 hours.  If you had a scopolamine patch placed behind your ear for the management of post- operative nausea and/or vomiting:  1. The medication in the patch is effective for 72 hours, after which it should be removed.  Wrap patch in a tissue and discard in the trash. Wash hands thoroughly with soap and water. 2. You may remove the patch earlier than 72 hours if you experience unpleasant side effects which may include dry mouth, dizziness or visual disturbances. 3. Avoid touching the patch. Wash your hands with soap and water after contact with the patch.

## 2016-03-20 NOTE — Anesthesia Preprocedure Evaluation (Addendum)
Anesthesia Evaluation  Patient identified by MRN, date of birth, ID band Patient awake    Reviewed: Allergy & Precautions, NPO status , Patient's Chart, lab work & pertinent test results  Airway Mallampati: II  TM Distance: >3 FB Neck ROM: Full    Dental no notable dental hx.    Pulmonary neg pulmonary ROS, former smoker,    Pulmonary exam normal breath sounds clear to auscultation       Cardiovascular negative cardio ROS Normal cardiovascular exam Rhythm:Regular Rate:Normal     Neuro/Psych negative neurological ROS  negative psych ROS   GI/Hepatic negative GI ROS, Neg liver ROS,   Endo/Other  negative endocrine ROS  Renal/GU negative Renal ROS  negative genitourinary   Musculoskeletal negative musculoskeletal ROS (+)   Abdominal   Peds negative pediatric ROS (+)  Hematology negative hematology ROS (+)   Anesthesia Other Findings   Reproductive/Obstetrics negative OB ROS                            Anesthesia Physical Anesthesia Plan  ASA: II  Anesthesia Plan: General   Post-op Pain Management:    Induction: Intravenous  Airway Management Planned: LMA  Additional Equipment:   Intra-op Plan:   Post-operative Plan: Extubation in OR  Informed Consent: I have reviewed the patients History and Physical, chart, labs and discussed the procedure including the risks, benefits and alternatives for the proposed anesthesia with the patient or authorized representative who has indicated his/her understanding and acceptance.   Dental advisory given  Plan Discussed with: CRNA and Surgeon  Anesthesia Plan Comments:        Anesthesia Quick Evaluation

## 2016-03-20 NOTE — Anesthesia Postprocedure Evaluation (Signed)
Anesthesia Post Note  Patient: Jessica [name]  Procedure(s) Performed: Procedure(s) (LRB): RIGHT BREAST LUMPECTOMY WITH RADIOACTIVE SEED LOCALIZATION, AND RIGHT BREAST PARTIAL MASTECTOMY (Right)  Patient location during evaluation: PACU Anesthesia Type: General Level of consciousness: awake and alert Pain management: pain level controlled Vital Signs Assessment: post-procedure vital signs reviewed and stable Respiratory status: spontaneous breathing, nonlabored ventilation, respiratory function stable and patient connected to nasal cannula oxygen Cardiovascular status: blood pressure returned to baseline and stable Postop Assessment: no signs of nausea or vomiting Anesthetic complications: no    Last Vitals:  Filed Vitals:   03/20/16 1317 03/20/16 1318  BP: 129/72   Pulse: 96 92  Temp: 36.9 C   Resp: 25 17    Last Pain:  Filed Vitals:   03/20/16 1324  PainSc: 0-No pain                 Masha Orbach S

## 2016-03-20 NOTE — Interval H&P Note (Signed)
History and Physical Interval Note: no change in H and P  03/20/2016 11:07 AM  Jessica [name]  has presented today for surgery, with the diagnosis of RIGHT BREAST CANCER, RIGHT BREAST ALH  The various methods of treatment have been discussed with the patient and family. After consideration of risks, benefits and other options for treatment, the patient has consented to  Procedure(s): RIGHT BREAST LUMPECTOMY WITH RADIOACTIVE SEED LOCALIZATION, AND RIGHT BREAST PARTIAL MASTECTOMY (Right) as a surgical intervention .  The patient's history has been reviewed, patient examined, no change in status, stable for surgery.  I have reviewed the patient's chart and labs.  Questions were answered to the patient's satisfaction.     Ruffin Lada A

## 2016-03-21 ENCOUNTER — Encounter (HOSPITAL_BASED_OUTPATIENT_CLINIC_OR_DEPARTMENT_OTHER): Payer: Self-pay | Admitting: Surgery

## 2016-03-21 NOTE — Op Note (Signed)
NAMEGOLDIA, BOECK NO.:  [id number]  MEDICAL RECORD NO.:  [id number]  LOCATION:  XRAY                         FACILITY:  [facility]  PHYSICIAN:  Coralie Keens, M.D. DATE OF BIRTH:  16-Jul-1935  DATE OF PROCEDURE:  03/20/2016 DATE OF DISCHARGE:  02/10/2016                              OPERATIVE REPORT   PREOPERATIVE DIAGNOSES: 1. Right breast cancer. 2. Atypical lobular hyperplasia of right breast.  POSTOPERATIVE DIAGNOSES: 1. Right breast cancer. 2. Atypical lobular hyperplasia of right breast.  PROCEDURES: 1. Right breast partial mastectomy. 2. Right breast radioactive seed localized lumpectomy.  SURGEON:  Coralie Keens, M.D.  ANESTHESIA:  General and 0.5% Marcaine.  ESTIMATED BLOOD LOSS:  Minimal.  INDICATIONS:  This is a 81 year old female, who presented with an abnormal skin lesion on the right in her breast.  This was biopsied by Dermatology and found to be consistent with invasive breast cancer.  She will have an MRI showing her to have 2.8 cm lesion at this area in the inner lower breast going down to the chest wall muscles.  There was also a small 6 mm separate lesion in the lower outer breast.  Stereotactic biopsy shows abnormal sclerosing cells with atypical lobular hyperplasia.  Decision was made to proceed with the radioactive seed lumpectomy of the small lesion and a partial mastectomy of the large lesion.  PROCEDURE IN DETAIL:  The patient was identified in the holding area as [employer].  The Neoprobe was used to confirm that the radioactive seed was indeed in the right breast.  She was then taken to the operating room.  She was placed supine on the operating room table and general anesthesia was induced.  Her right breast was then prepped and draped in usual sterile fashion.  I first made a circumareolar incision at the outer edge of the right breast with a scalpel.  I then took this down to the breast tissue with  electrocautery.  With the aid of the Neoprobe, I then performed a lumpectomy going circumferentially around the radioactive seed.  Once the specimen was completely removed, I marked all margins with marker paint.  The x-ray was performed with the specimen showing both the radioactive seed and previously placed marker. This was sent to Pathology for evaluation.  I then achieved hemostasis with the cautery.  I next turned my attention towards the medial lesion.  At this point, I made a large elliptical incision in the lower inner quadrant of the breast encompassing the dimpling of the skin.  I then took this down to the breast tissue with the electrocautery.  I then performed superior and inferior skin flaps with a low electrocautery.  I then took this down to the chest wall.  I then moved from medial to lateral performing the partial mastectomy removing all the skin and breast tissue going down to the chest wall.  Once, I got to the area where it appeared to be closest to the chest wall, I took underlying muscle underneath the specimen.  Once the specimen was completely removed, I marked all margins with marker paint and was sent to Pathology for evaluation.  I then achieved hemostasis with cautery.  I  anesthetized both wounds with Marcaine.  I then closed both incisions with interrupted 3-0 Vicryl sutures in the subcutaneous tissue and then running 4-0 Monocryl sutures.  Skin glue was then applied.  The patient was then placed in a breast binder.  The patient tolerated the procedure well.  All sponge, needle, and instrument counts were correct at the end of the procedure.  The patient was then extubated in the operating room and taken in stable condition to the recovery room.     Coralie Keens, M.D.     DB/MEDQ  D:  03/20/2016  T:  03/21/2016  Job:  [id number]

## 2016-03-27 ENCOUNTER — Telehealth: Payer: Self-pay | Admitting: *Deleted

## 2016-03-27 NOTE — Telephone Encounter (Signed)
Ordered Oncotype Dx test per MD.  Faxed request to [facility] and called Jessica [name] to verify she received it.  Faxed order to [facility].  Faxed order to [facility].   Placed a note to f/u for results.

## 2016-03-29 ENCOUNTER — Telehealth: Payer: Self-pay | Admitting: *Deleted

## 2016-03-29 NOTE — Telephone Encounter (Signed)
Pt called and relate she did not want to pursue oncotype testing. Discussed reasoning for testing. Pt continue to not want to proceed. Pt relate she will come for f/u with Dr. Jana Hakim on 6/27. Denies further needs.

## 2016-04-10 ENCOUNTER — Telehealth: Payer: Self-pay | Admitting: Oncology

## 2016-04-10 ENCOUNTER — Encounter: Payer: Self-pay | Admitting: Oncology

## 2016-04-10 ENCOUNTER — Ambulatory Visit (HOSPITAL_BASED_OUTPATIENT_CLINIC_OR_DEPARTMENT_OTHER): Payer: Medicare Other | Admitting: Oncology

## 2016-04-10 VITALS — BP 137/75 | HR 85 | Temp 98.4°F | Resp 17 | Ht 62.0 in | Wt 160.5 lb

## 2016-04-10 DIAGNOSIS — Z170 Estrogen receptor positive status [ER+]: Secondary | ICD-10-CM

## 2016-04-10 DIAGNOSIS — C50311 Malignant neoplasm of lower-inner quadrant of right female breast: Secondary | ICD-10-CM | POA: Diagnosis not present

## 2016-04-10 MED ORDER — LETROZOLE 2.5 MG PO TABS: 2.5000 mg | ORAL_TABLET | Freq: Every day | ORAL | Status: DC

## 2016-04-10 NOTE — Telephone Encounter (Signed)
appt made and avs printed °

## 2016-04-10 NOTE — Progress Notes (Signed)
Stable to improved status was results and this is Coumadin adjusted shock [facility]  Telephone:(336) [phone] [phone]     ID: Jessica [name] DOB: [birth year]  MR#: [id number]  GGE#:[id number]  Patient Care Team: No Pcp Per Patient as PCP - General (General Practice) Jessica Cruel, MD as Consulting Physician (Oncology) Jessica Monarch, MD as Consulting Physician (Dermatology) PCP: No PCP Per Patient GYN: SU:  OTHER MD:  CHIEF COMPLAINT: right-sided breast cancer  CURRENT TREATMENT: staging evaluation in progress   BREAST CANCER HISTORY: "Jessica [name]" was cutting some roses in her garden for someone's birthday when she scratched herself. In the process of dealing with that she discovered a spot under her right breast. She called [facility] and was evaluated by Dr. Denna [name] 01/16/2016. He noted among other lesions of benign nature and area under the right breast which appeared inflamed and indented. Biopsy was obtained and showed (DAA [id number]) metastatic carcinoma, which was estrogen and progesterone receptor positive. The lesion extended to the margins of the biopsy.  The patient's subsequent history is as detailed below.  INTERVAL HISTORY: Jessica [name] as supposed to have followed up with me in May but she never did so we have a lot of material to review. She came by herself.   She had a CT scan of the chest and a bone scan 02/10/2016 neither of which showed evidence of metastatic disease. She had a breast MRI 02/17/2016.  This showed the previously biopsied lower inner quadrant breastcancer,but in addition a 0.6 cm mass in the lower outer quadrant of the right breas tlower outer quadrant. This was subsequently biopsied  02/22/2016, which showed ([id number]) a complex sclerosing lesion with atypical ductal hyperplasia.   Accordingly on 03/20/2016 she proceeded to double lumpectomy. The area of atypical ductal hyperplasia was removed. In addition the lower  inner quadrant lumpectomy showed an invasive ductal carcinoma, measuring 2.5 cm, grade 1, with negative margins. The repeat prognostic panel showed this to be estrogen receptor positive at 100%, progesterone receptor +15%, both with strong staining intensity, with an MIB-1 of 5%, and no HER-2 amplification, the signals ratio being 1.40 and the number per cell 3.88.  Jessica [name] has continued on anastrozole, which she obtains for $17/ 3 month supply. However it is making her feel "bad". Her legs ache, and really everything aches particularly in the morning. She wants to stop all medication and a case because she says the surgeon told her he "got it all".   REVIEW OF SYSTEMS: Jessica [name] did okay with the surgery, but had trouble with the MRI, and says the contrast made her hallucinate. She did not have an allergic reaction. She says since she started the anastrozole she's been having pain all over and particularly involving the hips. It worsened the morning. A detailed review of systems today was otherwise noncontributory  PAST MEDICAL HISTORY: Past Medical History  Diagnosis Date  . Nephrolithiasis   . Breast cancer ([place])     rt breast    PAST SURGICAL HISTORY: Past Surgical History  Procedure Laterality Date  . Total abdominal hysterectomy w/ bilateral salpingoophorectomy    . Appendectomy    . Cataract extraction, bilateral    . Abdominal hysterectomy    . Breast surgery Left     breast lumpectomy  . Wrist fracture surgery Right   . Breast lumpectomy with radioactive seed localization Right 03/20/2016    Procedure: RIGHT BREAST LUMPECTOMY WITH RADIOACTIVE SEED LOCALIZATION, AND RIGHT BREAST PARTIAL MASTECTOMY;  Surgeon: Jessica [name]  Jessica Linden, MD;  Location: [facility];  Service: General;  Laterality: Right;    FAMILY HISTORY No family history on file.  The patient's father died at the age of 77 from a heart attack in the setting of tobacco abuse. The patient's mother died at the age  of 82 from suicide. The patient had 6 brothers, 5 sisters. 5 brothers are dead. 2 committed suicide. One was shot. One had an automobile accident, and one died in a house fire. Of the patient's 5 sisters one died with Lyme disease. There is no breast or ovarian cancer in the family to the patient's knowledge  GYNECOLOGIC HISTORY:  No LMP recorded. Patient is postmenopausal.  menarche age 38 first live birth age 52 the patient is Jessica [name] P3. She underwent hysterectomy with bilateral salpingo-oophorectomy about 50 years ago" and took hormone replacement "about 20 years".    SOCIAL HISTORY:   she used to work in a [employer] but is retired. Her  Husband Jessica [name] died 54 years ago from lung cancer and complications of smoking and alcohol. The patient lives alone with her dachshund.  Her children are Jessica [name], lives in [place] and is retired from long fourth metastases; Jessica [name] lives in [place] and is retired from working in a [employer], and Medical laboratory scientific officer, who lives in [place] and is retired from a [employer]. The patient has 3 granddaughters and 3 great-granddaughters. She is not a Ambulance person.    ADVANCED DIRECTIVES:  Not in place. At the visit 02/02/2016 the patient was given advanced directive forms to complete and notarize at her discretion   HEALTH MAINTENANCE: Social History  Substance Use Topics  . Smoking status: Former Smoker    Quit date: 02/02/2004  . Smokeless tobacco: Not on file  . Alcohol Use: No     Colonoscopy:  never  PAP:  Status post hysterectomy  Bone density:  never  Lipid panel:  No Known Allergies  Current Outpatient Prescriptions  Medication Sig Dispense Refill  . anastrozole (ARIMIDEX) 1 MG tablet Take 1 tablet (1 mg total) by mouth daily. 90 tablet 4  . HYDROcodone-acetaminophen (NORCO) 5-325 MG tablet Take 1-2 tablets by mouth every 4 (four) hours as needed for moderate pain. 30 tablet 0   No current facility-administered medications for this visit.     OBJECTIVE:  Older white woman wWho appears stated age 81 Vitals:   04/10/16 1512  BP: 137/75  Pulse: 85  Temp: 98.4 F (36.9 C)  Resp: 17     Body mass index is 29.35 kg/(m^2).    ECOG FS:1 - Symptomatic but completely ambulatory  Sclerae unicteric, pupils round and equal Oropharynx clear and moist-- no thrush or other lesions No cervical or supraclavicular adenopathy Lungs no rales or rhonchi Heart regular rate and rhythm Abd soft, nontender, positive bowel sounds MSK no focal spinal tenderness, no upper extremity lymphedema Neuro: nonfocal, well oriented, appropriate affect Breasts: The right breast is status post recent lumpectomy. The incision is healing very nicely. There is minimal erythema over the breast. The breast contour is well preserved. There is no evidence of residual or recurrent disease. The right axilla is benign. The left breast is unremarkable  LAB RESULTS:  CMP     Component Value Date/Time   NA 143 02/02/2016 1544   NA 137 05/04/2010 0846   K 3.9 02/02/2016 1544   K 3.8 05/04/2010 0846   CL 102 05/04/2010 0846   CO2 28 02/02/2016 1544   CO2 27 05/04/2010  0846   GLUCOSE 99 02/02/2016 1544   GLUCOSE 121* 05/04/2010 0846   BUN 18.2 02/02/2016 1544   BUN 16 05/04/2010 0846   CREATININE 0.9 02/02/2016 1544   CREATININE 0.80 05/04/2010 0846   CALCIUM 9.4 02/02/2016 1544   CALCIUM 9.1 05/04/2010 0846   PROT 7.6 02/02/2016 1544   PROT 7.0 05/04/2010 0846   ALBUMIN 3.8 02/02/2016 1544   ALBUMIN 3.9 05/04/2010 0846   AST 17 02/02/2016 1544   AST 19 05/04/2010 0846   ALT 16 02/02/2016 1544   ALT 17 05/04/2010 0846   ALKPHOS 105 02/02/2016 1544   ALKPHOS 100 05/04/2010 0846   BILITOT 0.32 02/02/2016 1544   BILITOT 0.5 05/04/2010 0846   GFRNONAA >60 05/04/2010 0846   GFRAA  05/04/2010 0846    >60        The eGFR has been calculated using the MDRD equation. This calculation has not been validated in all clinical situations. eGFR's  persistently <60 mL/min signify possible Chronic Kidney Disease.    INo results found for: SPEP, UPEP  Lab Results  Component Value Date   WBC 7.8 02/02/2016   NEUTROABS 5.9 02/02/2016   HGB 14.7 02/02/2016   HCT 45.3 02/02/2016   MCV 87.1 02/02/2016   PLT 219 02/02/2016      Chemistry      Component Value Date/Time   NA 143 02/02/2016 1544   NA 137 05/04/2010 0846   K 3.9 02/02/2016 1544   K 3.8 05/04/2010 0846   CL 102 05/04/2010 0846   CO2 28 02/02/2016 1544   CO2 27 05/04/2010 0846   BUN 18.2 02/02/2016 1544   BUN 16 05/04/2010 0846   CREATININE 0.9 02/02/2016 1544   CREATININE 0.80 05/04/2010 0846      Component Value Date/Time   CALCIUM 9.4 02/02/2016 1544   CALCIUM 9.1 05/04/2010 0846   ALKPHOS 105 02/02/2016 1544   ALKPHOS 100 05/04/2010 0846   AST 17 02/02/2016 1544   AST 19 05/04/2010 0846   ALT 16 02/02/2016 1544   ALT 17 05/04/2010 0846   BILITOT 0.32 02/02/2016 1544   BILITOT 0.5 05/04/2010 0846       No results found for: LABCA2  No components found for: LABCA125  No results for input(s): INR in the last 168 hours.  Urinalysis    Component Value Date/Time   COLORURINE YELLOW 05/04/2010 0846   APPEARANCEUR CLOUDY* 05/04/2010 0846   LABSPEC 1.026 05/04/2010 0846   PHURINE 5.0 05/04/2010 0846   GLUCOSEU NEGATIVE 05/04/2010 0846   HGBUR LARGE* 05/04/2010 0846   BILIRUBINUR SMALL* 05/04/2010 0846   KETONESUR 15* 05/04/2010 0846   PROTEINUR NEGATIVE 05/04/2010 0846   UROBILINOGEN 0.2 05/04/2010 0846   NITRITE NEGATIVE 05/04/2010 0846   LEUKOCYTESUR TRACE* 05/04/2010 0846    STUDIES: No results found.  ASSESSMENT: 81 y.o. Shea Stakes, [place] woman evaluated by dermatology with a shave biopsy of a lesion from the lower inner quadrant skin of the right breast 01/16/2016 showing carcinoma, estrogen and progesterone receptor positive, clinically T1c NX.  (a) biopsy of a right breast lower outer quadrant lesion 02/22/2016 showed a complex sclerosing  lesion with atypical ductal hyperplasia  (1) anastrozole started 02/02/2016, discontinued 04/10/2016 with arthralgias/myalgias  (2) right lumpectomy 2 on 03/20/2016 showed invasive ductal carcinoma, pT2 pNX, stage II, again estrogen and progesterone receptor positive, with an MIB-1 of 5%, and HER-2 nonamplified  (3) patient refuses Oncotype. Given the patient's age, and the fact that this is a slow-growing grade 1 tumor,  I will not recommend chemotherapy  (4) the patient refuses radiation.  (5) to start letrozole 05/15/2016  PLAN: I spent approximately 45 minutes today with Eyvette going over her situation in detail. I wrote all the information down, and I also gave her copies of all her scans and pathology reports.  She understands that although the surgeon "got it all", and in fact margins are clear, this cancer has been in her body for several years. It has had time to spread to other parts of her body if it was destined to do that. On his stage II tumor the chances of that happening are somewhere between 25 and 40%.  She understands also that although the CT scan of the chest and bone scan were clear, it cannot rule out microscopic occult disease.  We then discussed treatment of breast cancer in general. She knows that the standard of care after lumpectomy involves radiation for local control. In women over 1 who have small tumors that are node-negative radiation can be avoided if they take anti-estrogens for 5 years. However her tumor is stage II and we did not sample her lymph nodes. She understands adjuvant radiation will further "cleanout" the surgical bed. Nevertheless she is very much against radiation and no radiation treatment is being planned at this point  In terms of systemic therapy, while I would have preferred an Oncotype, in the absence of that information I would not recommend chemotherapy in a woman who is nearly 81 years old, has a grade 1 tumor with an MIB-1 of 5%, and no  evidence of metastatic disease.  Accordingly we will depend on anti-estrogens for systemic therapy. She gave the anastrozole a good try, continuing to take it even though it did cause her some arthralgias and myalgias. We are stopping that medication. We're going to let the anastrozole "washout of her body", and she will start letrozole 05/15/2016 area if she has difficulty obtaining it for financial reasons she will let us know.  She will then see me again in October. If she is tolerating the letrozole well the plan will be to continue that for a total of 5 years. Otherwise we will switch to tamoxifen (she is status post hysterectomy, which favors that).  Denis knows to call for any problems that may develop before the next visit here.  Jessica Cruel, MD   04/10/2016 3:24 PM Medical Oncology and Hematology [facility] [address] [place], [place] [address] Tel. [phone]    Fax. [phone]

## 2016-04-17 ENCOUNTER — Other Ambulatory Visit: Payer: Self-pay | Admitting: Oncology

## 2016-04-20 ENCOUNTER — Other Ambulatory Visit: Payer: Self-pay | Admitting: *Deleted

## 2016-04-20 DIAGNOSIS — C50311 Malignant neoplasm of lower-inner quadrant of right female breast: Secondary | ICD-10-CM

## 2016-07-17 ENCOUNTER — Ambulatory Visit (HOSPITAL_BASED_OUTPATIENT_CLINIC_OR_DEPARTMENT_OTHER): Payer: Medicare Other | Admitting: Oncology

## 2016-07-17 VITALS — BP 132/67 | HR 98 | Temp 97.3°F | Resp 18 | Ht 62.0 in | Wt 159.7 lb

## 2016-07-17 DIAGNOSIS — Z170 Estrogen receptor positive status [ER+]: Secondary | ICD-10-CM | POA: Diagnosis not present

## 2016-07-17 DIAGNOSIS — C50311 Malignant neoplasm of lower-inner quadrant of right female breast: Secondary | ICD-10-CM | POA: Diagnosis not present

## 2016-07-17 MED ORDER — TAMOXIFEN CITRATE 20 MG PO TABS: 20.0000 mg | ORAL_TABLET | Freq: Every day | ORAL | 12 refills | Status: AC

## 2016-07-17 NOTE — Progress Notes (Signed)
Stable to improved status was results and this is Coumadin adjusted shock [place]  Telephone:(336) [phone] [phone]     ID: Jessica [name] DOB: [birth year]  MR#: [id number]  DUK#:[id number]  Patient Care Team: No Pcp Per Patient as PCP - General (General Practice) Jessica Cruel, MD as Consulting Physician (Oncology) Jessica Monarch, MD as Consulting Physician (Dermatology) PCP: No PCP Per Patient GYN: SU:  OTHER MD:  CHIEF COMPLAINT: right-sided breast cancer  CURRENT TREATMENT: Tamoxifen  BREAST CANCER HISTORY: From the original intake note:  "Jessica [name]" was cutting some roses in her garden for someone's birthday when she scratched herself. In the process of dealing with that she discovered a spot under her right breast. She called [facility] and was evaluated by Dr. Denna [name] 01/16/2016. He noted among other lesions of benign nature and area under the right breast which appeared inflamed and indented. Biopsy was obtained and showed (DAA [id number]) metastatic carcinoma, which was estrogen and progesterone receptor positive. The lesion extended to the margins of the biopsy.  The patient's subsequent history is as detailed below.  INTERVAL HISTORY: Jessica [name] returns today for follow-up of her estrogen receptor positive breast cancer. She was supposed to have started an aromatase inhibitor in August, but this never happened. In fact she has done some reading on it and is very concerned about the possible side effects primarily fatigue and arthralgias and myalgias. She is here to discuss alternatives.  REVIEW OF SYSTEMS: Jessica [name] is doing "good", taking care of her household as she usually does, driving, cooking, and generally enjoying a normal functional status for someone her age. She is very proud that she is on no medication at present. A detailed review of systems today was otherwise stable  PAST MEDICAL HISTORY: Past Medical History:  Diagnosis Date    . Breast cancer ([place])    rt breast  . Nephrolithiasis     PAST SURGICAL HISTORY: Past Surgical History:  Procedure Laterality Date  . ABDOMINAL HYSTERECTOMY    . APPENDECTOMY    . BREAST LUMPECTOMY WITH RADIOACTIVE SEED LOCALIZATION Right 03/20/2016   Procedure: RIGHT BREAST LUMPECTOMY WITH RADIOACTIVE SEED LOCALIZATION, AND RIGHT BREAST PARTIAL MASTECTOMY;  Surgeon: Coralie Keens, MD;  Location: [facility];  Service: General;  Laterality: Right;  . BREAST SURGERY Left    breast lumpectomy  . CATARACT EXTRACTION, BILATERAL    . TOTAL ABDOMINAL HYSTERECTOMY W/ BILATERAL SALPINGOOPHORECTOMY    . WRIST FRACTURE SURGERY Right     FAMILY HISTORY No family history on file.  The patient's father died at the age of 20 from a heart attack in the setting of tobacco abuse. The patient's mother died at the age of 1 from suicide. The patient had 6 brothers, 5 sisters. 5 brothers are dead. 2 committed suicide. One was shot. One had an automobile accident, and one died in a house fire. Of the patient's 5 sisters one died with Lyme disease. There is no breast or ovarian cancer in the family to the patient's knowledge  GYNECOLOGIC HISTORY:  No LMP recorded. Patient is postmenopausal.  menarche age 30 first live birth age 45 the patient is Jessica [name] P3. She underwent hysterectomy with bilateral salpingo-oophorectomy about 50 years ago" and took hormone replacement "about 20 years".    SOCIAL HISTORY:  I she used to work in a [employer] but is retired. Her  Husband Jessica [name] died 35 years ago from lung cancer and complications of smoking and alcohol. The patient lives alone  with her dachshund.  Her children are Jessica [name], lives in [place] and is retired from long fourth metastases; Jessica [name] lives in [place] and is retired from working in a [employer], and Medical laboratory scientific officer, who lives in [place] and is retired from a [employer]. The patient has 3 granddaughters and 3 great-granddaughters. She is not a  Ambulance person.    ADVANCED DIRECTIVES:  Not in place. At the visit 02/02/2016 the patient was given advanced directive forms to complete and notarize at her discretion   HEALTH MAINTENANCE: Social History  Substance Use Topics  . Smoking status: Former Smoker    Quit date: 02/02/2004  . Smokeless tobacco: Not on file  . Alcohol use No     Colonoscopy:  never  PAP:  Status post hysterectomy  Bone density:  never  Lipid panel:  No Known Allergies  Current Outpatient Prescriptions  Medication Sig Dispense Refill  . tamoxifen (NOLVADEX) 20 MG tablet Take 1 tablet (20 mg total) by mouth daily. 90 tablet 12   No current facility-administered medications for this visit.     OBJECTIVE:  Older white [name] In no acute distress Vitals:   07/17/16 1003  BP: 132/67  Pulse: 98  Resp: 18  Temp: 97.3 F (36.3 C)     Body mass index is 29.21 kg/m.    ECOG FS:0 - Asymptomatic  Sclerae unicteric, EOMs intact Oropharynx clear and moist No cervical or supraclavicular adenopathy Lungs no rales or rhonchi Heart regular rate and rhythm Abd soft, nontender, positive bowel sounds MSK no focal spinal tenderness, no upper extremity lymphedema Neuro: nonfocal, well oriented, appropriate affect Breasts: The right breast is status post lumpectomy. The incisions have healed nicely. The cosmetic result is good. There is no evidence of local recurrence. The right axilla is benign. Left breast is unremarkable.  LAB RESULTS:  CMP     Component Value Date/Time   NA 143 02/02/2016 1544   K 3.9 02/02/2016 1544   CL 102 05/04/2010 0846   CO2 28 02/02/2016 1544   GLUCOSE 99 02/02/2016 1544   BUN 18.2 02/02/2016 1544   CREATININE 0.9 02/02/2016 1544   CALCIUM 9.4 02/02/2016 1544   PROT 7.6 02/02/2016 1544   ALBUMIN 3.8 02/02/2016 1544   AST 17 02/02/2016 1544   ALT 16 02/02/2016 1544   ALKPHOS 105 02/02/2016 1544   BILITOT 0.32 02/02/2016 1544   GFRNONAA >60 05/04/2010 0846   GFRAA   05/04/2010 0846    >60        The eGFR has been calculated using the MDRD equation. This calculation has not been validated in all clinical situations. eGFR's persistently <60 mL/min signify possible Chronic Kidney Disease.    INo results found for: SPEP, UPEP  Lab Results  Component Value Date   WBC 7.8 02/02/2016   NEUTROABS 5.9 02/02/2016   HGB 14.7 02/02/2016   HCT 45.3 02/02/2016   MCV 87.1 02/02/2016   PLT 219 02/02/2016      Chemistry      Component Value Date/Time   NA 143 02/02/2016 1544   K 3.9 02/02/2016 1544   CL 102 05/04/2010 0846   CO2 28 02/02/2016 1544   BUN 18.2 02/02/2016 1544   CREATININE 0.9 02/02/2016 1544      Component Value Date/Time   CALCIUM 9.4 02/02/2016 1544   ALKPHOS 105 02/02/2016 1544   AST 17 02/02/2016 1544   ALT 16 02/02/2016 1544   BILITOT 0.32 02/02/2016 1544       No  results found for: LABCA2  No components found for: LABCA125  No results for input(s): INR in the last 168 hours.  Urinalysis    Component Value Date/Time   COLORURINE YELLOW 05/04/2010 0846   APPEARANCEUR CLOUDY (A) 05/04/2010 0846   LABSPEC 1.026 05/04/2010 0846   PHURINE 5.0 05/04/2010 0846   GLUCOSEU NEGATIVE 05/04/2010 0846   HGBUR LARGE (A) 05/04/2010 0846   BILIRUBINUR SMALL (A) 05/04/2010 0846   KETONESUR 15 (A) 05/04/2010 0846   PROTEINUR NEGATIVE 05/04/2010 0846   UROBILINOGEN 0.2 05/04/2010 0846   NITRITE NEGATIVE 05/04/2010 0846   LEUKOCYTESUR TRACE (A) 05/04/2010 0846    STUDIES: No results found.  ASSESSMENT: 81 y.o. Jessica [name], Jessica [name] evaluated by dermatology with a shave biopsy of a lesion from the lower inner quadrant skin of the right breast 01/16/2016 showing carcinoma, estrogen and progesterone receptor positive, clinically T1c NX.  (a) biopsy of a right breast lower inner quadrant lesion 02/22/2016 showed a complex sclerosing lesion with atypical ductal hyperplasia  (1) anastrozole started 02/02/2016, discontinued 04/10/2016  with arthralgias/myalgias  (2) right lumpectomy 2 on 03/20/2016 showed invasive ductal carcinoma, pT2 pNX, stage II, again estrogen and progesterone receptor positive, with an MIB-1 of 5%, and HER-2 nonamplified  (3) patient refuses Oncotype. Given the patient's age, and the fact that this is a slow-growing grade 1 tumor, I will not recommend chemotherapy  (4) the patient refuses radiation.  (5) to start Tamoxifen 07/17/2016  PLAN: I reviewed Karlissa situation with her. She understands that she did not receive radiation and therefore there is an increased risk of local recurrence. This will be significantly reduced and her risk of a new breast cancer at in half if she takes antiestrogen's for 5 years.  We discussed aromatase inhibitors but she is deterred by the possible side effects. On the other hand she is status post hysterectomy, which is a +4 tamoxifen. That means we don't have to worry about endometrial changes. She also took hormone replacement for 20 years with no evidence of a clot. Again that is reassuring.  After full discussion of the possible toxicities side effects and complications of tamoxifen she decided to give it a try and I went ahead and wrote the prescription for her.  She will see me again in January. If she is tolerating that well I will see her again 6 months later. She will be seeing Dr. Rush Farmer in March or April of next year.  She knows to call for any problems that may develop before the next visit.   :Jessica Cruel, MD   07/17/2016 7:16 PM Medical Oncology and Hematology [facility] [place], [place] [address] Tel. [phone]    Fax. [phone]

## 2016-10-30 ENCOUNTER — Ambulatory Visit: Payer: Medicare Other | Admitting: Oncology

## 2016-10-31 ENCOUNTER — Encounter: Payer: Self-pay | Admitting: Oncology

## 2017-03-21 DIAGNOSIS — Z853 Personal history of malignant neoplasm of breast: Secondary | ICD-10-CM | POA: Diagnosis not present

## 2017-03-21 DIAGNOSIS — R921 Mammographic calcification found on diagnostic imaging of breast: Secondary | ICD-10-CM | POA: Diagnosis not present

## 2017-03-27 ENCOUNTER — Other Ambulatory Visit: Payer: Self-pay

## 2017-03-27 ENCOUNTER — Other Ambulatory Visit: Payer: Self-pay | Admitting: Radiology

## 2017-03-27 DIAGNOSIS — R920 Mammographic microcalcification found on diagnostic imaging of breast: Secondary | ICD-10-CM | POA: Diagnosis not present

## 2017-03-27 DIAGNOSIS — N6091 Unspecified benign mammary dysplasia of right breast: Secondary | ICD-10-CM | POA: Diagnosis not present

## 2017-03-27 DIAGNOSIS — N6011 Diffuse cystic mastopathy of right breast: Secondary | ICD-10-CM | POA: Diagnosis not present

## 2017-03-27 DIAGNOSIS — N6031 Fibrosclerosis of right breast: Secondary | ICD-10-CM | POA: Diagnosis not present

## 2017-03-27 DIAGNOSIS — Z853 Personal history of malignant neoplasm of breast: Secondary | ICD-10-CM | POA: Diagnosis not present

## 2018-03-27 ENCOUNTER — Encounter: Payer: Self-pay | Admitting: Oncology

## 2018-03-27 DIAGNOSIS — R928 Other abnormal and inconclusive findings on diagnostic imaging of breast: Secondary | ICD-10-CM | POA: Diagnosis not present

## 2018-03-27 DIAGNOSIS — Z853 Personal history of malignant neoplasm of breast: Secondary | ICD-10-CM | POA: Diagnosis not present

## 2018-10-30 ENCOUNTER — Ambulatory Visit (INDEPENDENT_AMBULATORY_CARE_PROVIDER_SITE_OTHER): Payer: Medicare Other | Admitting: Family Medicine

## 2018-10-30 ENCOUNTER — Ambulatory Visit (INDEPENDENT_AMBULATORY_CARE_PROVIDER_SITE_OTHER): Payer: Self-pay

## 2018-10-30 ENCOUNTER — Encounter (INDEPENDENT_AMBULATORY_CARE_PROVIDER_SITE_OTHER): Payer: Self-pay | Admitting: Family Medicine

## 2018-10-30 VITALS — BP 142/81 | HR 80 | Temp 98.7°F | Resp 20 | Ht 63.25 in | Wt 159.4 lb

## 2018-10-30 DIAGNOSIS — M25562 Pain in left knee: Secondary | ICD-10-CM

## 2018-10-30 DIAGNOSIS — K5909 Other constipation: Secondary | ICD-10-CM

## 2018-10-30 DIAGNOSIS — Z0000 Encounter for general adult medical examination without abnormal findings: Secondary | ICD-10-CM

## 2018-10-30 NOTE — Patient Instructions (Signed)
    Knee pain:    - Glucosamine Sulfate 1,000 mg twice daily  - Turmeric capsules 500 mg twice daily can also help.  - If still not improving, I'll call in a prescription.   Constipation:  - Miralax once or twice daily for 5-7 days until having regular bowel movements.  - Once regular again, then start taking Magnesium pills 400 mg once daily for maintenance.

## 2018-10-30 NOTE — Progress Notes (Signed)
Office Visit Note   Patient: Jessica [name]           Date of Birth: 10/11/1935           MRN: [id number] Visit Date: 10/30/2018 Requested by: No referring provider defined for this encounter. PCP: Patient, No Pcp Per  Subjective: Chief Complaint  Patient presents with  . Left Knee - Pain    Pain mostly medial knee - hurts worse at night. She says there is a knot that comes & goes left lower leg.  . establish primary care  . Constipation    HPI: She is an 84 year old here to establish care.  She has 2 issues to discuss today.  Left knee has bothered her intermittently over the years but in the past couple months it has become more consistent.  It hurts on the medial aspect, mainly when sleeping at night.  Sometimes it keeps her awake.  It really does not bother her much during the day.  She thinks it started when getting up onto a riding mower a couple years ago.  She felt something pop at that point, and it has been intermittent pain since then.  The past 2 months she has been constipated with no good explanation.  Diet has not changed, she is not on any prescription medications.  Her energy seems to be good.  She has improvement with over-the-counter laxative, but only temporarily.  No blood in her stool.  She has never had a colonoscopy.  She is due for a wellness exam with labs.  She would like to have labs drawn today in anticipation of that.                ROS: She is otherwise been in good health, other systems were negative.  Objective: Vital Signs: BP (!) 142/81 (BP Location: Right Arm, Patient Position: Sitting, Cuff Size: Normal)   Pulse 80   Temp 98.7 F (37.1 C)   Resp 20   Ht 5' 3.25" (1.607 m)   Wt 159 lb 6.4 oz (72.3 kg)   SpO2 92%   BMI 28.01 kg/m   Physical Exam:  Neck: No thyromegaly or nodules.   CV: Regular rate and rhythm without murmurs, rubs, or gallops.  No peripheral edema.  2+ radial and posterior tibial pulses. Lungs: Clear to auscultation  throughout with no wheezing or areas of consolidation. Abd: Bowel sounds are active, no hepatosplenomegaly or masses.  Soft and nontender.  No audible bruits.  No evidence of ascites. Left knee: No effusion, no warmth or erythema.  Full extension and flexion of 125 degrees.  1+ patellofemoral crepitus, negative patella apprehension or compression test.  She is tender on the medial joint line, no palpable click with McMurray's.  Imaging: X-rays left knee: Moderate medial compartment joint space narrowing with no sign of loose body or stress fracture, no sign of neoplasm.   Assessment & Plan: 1.  Left hip pain, suspect degenerative medial meniscus tear. -Discussed various options with her and elected to try glucosamine.  If still no improvement then Voltaren gel or possibly a temporary prescription for Celebrex at bedtime.  2.  Constipation -MiraLAX daily for 5 to 7 days until having regular bowel movements then magnesium pills 400 mg once daily for maintenance. -Labs to check thyroid function.  3.  Wellness examination -Labs in anticipation of this.   Follow-Up Instructions: No follow-ups on file.      Procedures: No procedures performed  No notes on file  PMFS History: Patient Active Problem List   Diagnosis Date Noted  . Breast cancer of lower-inner quadrant of right female breast ([place]) 02/02/2016   Past Medical History:  Diagnosis Date  . Breast cancer ([place])    rt breast  . Nephrolithiasis     History reviewed. No pertinent family history.  Past Surgical History:  Procedure Laterality Date  . ABDOMINAL HYSTERECTOMY    . APPENDECTOMY    . BREAST LUMPECTOMY WITH RADIOACTIVE SEED LOCALIZATION Right 03/20/2016   Procedure: RIGHT BREAST LUMPECTOMY WITH RADIOACTIVE SEED LOCALIZATION, AND RIGHT BREAST PARTIAL MASTECTOMY;  Surgeon: Coralie Keens, MD;  Location: [facility];  Service: General;  Laterality: Right;  . BREAST SURGERY Left    breast lumpectomy    . CATARACT EXTRACTION, BILATERAL    . TOTAL ABDOMINAL HYSTERECTOMY W/ BILATERAL SALPINGOOPHORECTOMY    . WRIST FRACTURE SURGERY Right    Social History   Occupational History  . Not on file  Tobacco Use  . Smoking status: Former Smoker    Last attempt to quit: 02/02/2004    Years since quitting: 14.7  Substance and Sexual Activity  . Alcohol use: No    Alcohol/week: 0.0 standard drinks  . Drug use: No  . Sexual activity: Not on file

## 2018-10-31 ENCOUNTER — Telehealth (INDEPENDENT_AMBULATORY_CARE_PROVIDER_SITE_OTHER): Payer: Self-pay | Admitting: Family Medicine

## 2018-10-31 LAB — COMPREHENSIVE METABOLIC PANEL
AG Ratio: 1.4 (calc) (ref 1.0–2.5)
ALT: 16 U/L (ref 6–29)
AST: 16 U/L (ref 10–35)
Albumin: 4 g/dL (ref 3.6–5.1)
Alkaline phosphatase (APISO): 106 U/L (ref 33–130)
BUN: 15 mg/dL (ref 7–25)
CO2: 29 mmol/L (ref 20–32)
Calcium: 9.1 mg/dL (ref 8.6–10.4)
Chloride: 106 mmol/L (ref 98–110)
Creat: 0.88 mg/dL (ref 0.60–0.88)
Globulin: 2.9 g/dL (calc) (ref 1.9–3.7)
Glucose, Bld: 107 mg/dL — ABNORMAL HIGH (ref 65–99)
Potassium: 4.7 mmol/L (ref 3.5–5.3)
Sodium: 146 mmol/L (ref 135–146)
Total Bilirubin: 0.4 mg/dL (ref 0.2–1.2)
Total Protein: 6.9 g/dL (ref 6.1–8.1)

## 2018-10-31 LAB — CBC WITH DIFFERENTIAL/PLATELET
ABSOLUTE MONOCYTES: 338 {cells}/uL (ref 200–950)
BASOS PCT: 1 %
Basophils Absolute: 52 cells/uL (ref 0–200)
EOS PCT: 1.1 %
Eosinophils Absolute: 57 cells/uL (ref 15–500)
HEMATOCRIT: 42.6 % (ref 35.0–45.0)
Hemoglobin: 14.2 g/dL (ref 11.7–15.5)
LYMPHS ABS: 998 {cells}/uL (ref 850–3900)
MCH: 28.6 pg (ref 27.0–33.0)
MCHC: 33.3 g/dL (ref 32.0–36.0)
MCV: 85.9 fL (ref 80.0–100.0)
MPV: 11.3 fL (ref 7.5–12.5)
Monocytes Relative: 6.5 %
Neutro Abs: 3754 cells/uL (ref 1500–7800)
Neutrophils Relative %: 72.2 %
Platelets: 228 10*3/uL (ref 140–400)
RBC: 4.96 10*6/uL (ref 3.80–5.10)
RDW: 13.1 % (ref 11.0–15.0)
Total Lymphocyte: 19.2 %
WBC: 5.2 10*3/uL (ref 3.8–10.8)

## 2018-10-31 LAB — HIGH SENSITIVITY CRP: hs-CRP: 4.6 mg/L — ABNORMAL HIGH

## 2018-10-31 LAB — LIPID PANEL
Cholesterol: 208 mg/dL — ABNORMAL HIGH (ref <200)
HDL: 49 mg/dL — AB (ref >50)
LDL Cholesterol (Calc): 140 mg/dL (calc) — ABNORMAL HIGH
Non-HDL Cholesterol (Calc): 159 mg/dL (calc) — ABNORMAL HIGH (ref <130)
Total CHOL/HDL Ratio: 4.2 (calc) (ref <5.0)
Triglycerides: 90 mg/dL (ref <150)

## 2018-10-31 LAB — HEMOGLOBIN A1C
Hgb A1c MFr Bld: 5.7 % of total Hgb — ABNORMAL HIGH (ref <5.7)
Mean Plasma Glucose: 117 (calc)
eAG (mmol/L): 6.5 (calc)

## 2018-10-31 LAB — VITAMIN D 25 HYDROXY (VIT D DEFICIENCY, FRACTURES): Vit D, 25-Hydroxy: 13 ng/mL — ABNORMAL LOW (ref 30–100)

## 2018-10-31 LAB — THYROID PANEL WITH TSH
Free Thyroxine Index: 2.7 (ref 1.4–3.8)
T3 Uptake: 25 % (ref 22–35)
T4, Total: 10.7 ug/dL (ref 5.1–11.9)
TSH: 2.37 mIU/L (ref 0.40–4.50)

## 2018-10-31 NOTE — Telephone Encounter (Signed)
I called and advised the patient of the vitamin D results and instructions on taking vitamin D3 OTC ( I had her write down the information and read them back to me) - recheck in 6 months (when we will also recheck her glucose/hgb A1C). I also advised her to call us back if she has any questions/concerns that come up in the future.

## 2018-10-31 NOTE — Telephone Encounter (Signed)
Vitamin D severely low at 13 (target 50-80).  Take over the counter D3, 5,000 IU tablets, two daily (10,000 IU total) for 3 months and then 1 daily long-term after that.  Recheck in 6 months.

## 2018-10-31 NOTE — Telephone Encounter (Signed)
One result still pending.  Others normal except glucose and A1C which are in prediabetes range.  I recommend minimizing intake of processed carbs (breads; pastas; cereals; sugars/sweets).  Recheck in about 6 months.

## 2018-10-31 NOTE — Telephone Encounter (Signed)
I called and advised patient on the results and instructions on minimizing intake of carbs - recheck in 6 months.

## 2019-11-16 DIAGNOSIS — C7951 Secondary malignant neoplasm of bone: Secondary | ICD-10-CM

## 2019-11-16 HISTORY — DX: Secondary malignant neoplasm of bone: C79.51

## 2020-05-24 ENCOUNTER — Ambulatory Visit: Payer: Medicare Other | Admitting: Orthopaedic Surgery

## 2020-05-24 ENCOUNTER — Ambulatory Visit: Payer: Self-pay

## 2020-05-24 VITALS — Ht 63.25 in | Wt 160.0 lb

## 2020-05-24 DIAGNOSIS — M7061 Trochanteric bursitis, right hip: Secondary | ICD-10-CM

## 2020-05-24 DIAGNOSIS — M25551 Pain in right hip: Secondary | ICD-10-CM

## 2020-05-24 MED ORDER — METHYLPREDNISOLONE 4 MG PO TABS: ORAL_TABLET | ORAL | 0 refills | Status: DC

## 2020-05-24 NOTE — Progress Notes (Signed)
Office Visit Note   Patient: Jessica [name]           Date of Birth: 1935-09-07           MRN: [id number] Visit Date: 05/24/2020              Requested by: No referring provider defined for this encounter. PCP: Patient, No Pcp Per   Assessment & Plan: Visit Diagnoses:  1. Pain in right hip   2. Trochanteric bursitis, right hip     Plan: Based on her x-rays and clinical exam I feel this is more likely trochanteric bursitis.  I talked her about trying a steroid injection around this area and I explained the risk and benefits of steroid injections and she tolerated well.  I will have her use a cane in the opposite hand and given the severity of her pain I would like to see her back in just 2 weeks to see how she is doing overall.  I also sent in a 6-day steroid taper.  All questions and concerns were answered and addressed.  In 2 weeks we do not need x-rays I just need to see her from a clinical exam standpoint out of my concern of her limp.  Follow-Up Instructions: Return in about 2 weeks (around 06/07/2020).   Orders:  Orders Placed This Encounter  Procedures   XR HIP UNILAT W OR W/O PELVIS 1V RIGHT   Meds ordered this encounter  Medications   methylPREDNISolone (MEDROL) 4 MG tablet    Sig: Medrol dose pack. Take as instructed    Dispense:  21 tablet    Refill:  0      Procedures: No procedures performed   Clinical Data: No additional findings.   Subjective: Chief Complaint  Patient presents with   Right Hip - Pain  The patient is an 85 year old who comes in with significant right hip pain is been getting worse for several weeks now.  She denies any injuries.  She is an active 85 year old who would like to get back to mowing her grass.  She is walking with a limp.  She is not a diabetic and has not had hip issues before.  She denies any left hip pain or any back issues.  She denies any radicular symptoms.  She shows me how she walks with her right foot externally  rotated some to take pressure off of her hip.  She says when she rolls over on that right hip at night it is very painful.  HPI  Review of Systems She currently denies any headache, chest pain, shortness of breath, fever, chills, nausea, vomiting  Objective: Vital Signs: Ht 5' 3.25" (1.607 m)    Wt 160 lb (72.6 kg)    BMI 28.12 kg/m   Physical Exam She is alert and orient x3 and in no acute distress Ortho Exam Examination of her left hip is normal examination of her right hip shows pain all along the trochanteric area with internal and external rotation.  There is a little bit of a component of groin pain. Specialty Comments:  No specialty comments available.  Imaging: XR HIP UNILAT W OR W/O PELVIS 1V RIGHT  Result Date: 05/24/2020 An AP pelvis and lateral right hip shows no acute findings.  The hip joint space is well-maintained.    PMFS History: Patient Active Problem List   Diagnosis Date Noted   Breast cancer of lower-inner quadrant of right female breast ([place]) 02/02/2016   Past Medical  History:  Diagnosis Date   Breast cancer ([place])    rt breast   Nephrolithiasis     No family history on file.  Past Surgical History:  Procedure Laterality Date   ABDOMINAL HYSTERECTOMY     APPENDECTOMY     BREAST LUMPECTOMY WITH RADIOACTIVE SEED LOCALIZATION Right 03/20/2016   Procedure: RIGHT BREAST LUMPECTOMY WITH RADIOACTIVE SEED LOCALIZATION, AND RIGHT BREAST PARTIAL MASTECTOMY;  Surgeon: Coralie Keens, MD;  Location: [facility];  Service: General;  Laterality: Right;   BREAST SURGERY Left    breast lumpectomy   CATARACT EXTRACTION, BILATERAL     TOTAL ABDOMINAL HYSTERECTOMY W/ BILATERAL SALPINGOOPHORECTOMY     WRIST FRACTURE SURGERY Right    Social History   Occupational History   Not on file  Tobacco Use   Smoking status: Former Smoker    Quit date: 02/02/2004    Years since quitting: 16.3  Substance and Sexual Activity   Alcohol use: No      Alcohol/week: 0.0 standard drinks   Drug use: No   Sexual activity: Not on file

## 2020-06-07 ENCOUNTER — Ambulatory Visit: Payer: Medicare Other | Admitting: Orthopaedic Surgery

## 2020-06-07 ENCOUNTER — Ambulatory Visit: Payer: Self-pay

## 2020-06-07 DIAGNOSIS — M25551 Pain in right hip: Secondary | ICD-10-CM

## 2020-06-07 NOTE — Progress Notes (Signed)
Subjective: Patient is here for ultrasound-guided intra-articular right hip injection.   Anterolateral pain for several weeks.  DJD on x-ray.  Objective:  Mild GT tenderness, and some pain with passive IR.  Procedure: Ultrasound-guided right hip injection: After sterile prep with Betadine, injected 8 cc 1% lidocaine without epinephrine and 40 mg methylprednisolone using a 22-gauge spinal needle, passing the needle through the iliofemoral ligament into the femoral head/neck junction.  Injectate seen filling joint capsule.  Minimal change with lidocaine.

## 2020-06-07 NOTE — Progress Notes (Signed)
Jessica [name] is an 85 year old female who comes in today 2 weeks after I saw her for right hip pain.  I did provide injection of the trochanteric area of her right hip and she said that helped quite a bit but she is still experiencing a lot of pain in her groin.  I want to see her back this soon because I was concerned about how she walks in terms of how she favors that hip.  She says that she has been sitting for a while and does then sit up and walk that she has a lot of pain in the hip joint in the groin itself.  She did demonstrate that today as well when I had her get up and walk around.  On exam when I still put her hip through internal and external rotation it moves smoothly but is painful in the groin.  She has not had any recent injuries either with that right hip.  I did look at the x-rays again from her last visit and did not see any type of fracture lines or deformities with the hip itself on the right side or the left side.  I would like to send her up today to Dr. Junius Roads for an ultrasound-guided right hip joint injection with a steroid.  We can then see her back in follow-up about 2 weeks after that injection.  She agrees with this treatment plan.  If she still having considerable hip pain at the next visit I would like a new AP and lateral of the right hip.

## 2020-10-12 DIAGNOSIS — R350 Frequency of micturition: Secondary | ICD-10-CM | POA: Diagnosis not present

## 2020-10-12 DIAGNOSIS — N200 Calculus of kidney: Secondary | ICD-10-CM | POA: Diagnosis not present

## 2020-10-12 DIAGNOSIS — M545 Low back pain, unspecified: Secondary | ICD-10-CM | POA: Diagnosis not present

## 2020-10-19 ENCOUNTER — Ambulatory Visit: Payer: Medicare Other | Admitting: Physician Assistant

## 2020-10-19 ENCOUNTER — Encounter: Payer: Self-pay | Admitting: Physician Assistant

## 2020-10-19 ENCOUNTER — Ambulatory Visit: Payer: Self-pay

## 2020-10-19 DIAGNOSIS — M25551 Pain in right hip: Secondary | ICD-10-CM | POA: Diagnosis not present

## 2020-10-19 NOTE — Progress Notes (Signed)
HPI: Jessica [name] returns today after having a intra-articular injection by Dr. Prince Rome on 06/07/2020.  She states the injection did not help at all with her pain.  She states pain in the hip is getting worse.  She states that the pain is deep within the groin.  She is having to use a cane to ambulate.  She denies any radicular symptoms down the right leg.  She has had no new injury.  Review of systems: Please see HPI otherwise negative  Physical exam: Left hip good range of motion without pain.  Right hip she has extreme pain with internal rotation.  Fluid motion of both hips with range of motion.  Radiographs AP pelvis lateral view of the right hip shows cystic-like changes within the femoral head.  No acute fractures.  Hip joints well-maintained.  Impression: Right hip pain  Plan: We will obtain an MRI of her right hip to rule out AVN.  Have her follow-up after the MRI to go over results and discuss further treatment.  Questions encouraged and  answered

## 2020-10-20 NOTE — Addendum Note (Signed)
Addended by: Barbette Or on: 10/20/2020 08:12 AM   Modules accepted: Orders

## 2020-10-22 ENCOUNTER — Ambulatory Visit (HOSPITAL_BASED_OUTPATIENT_CLINIC_OR_DEPARTMENT_OTHER)
Admission: RE | Admit: 2020-10-22 | Discharge: 2020-10-22 | Disposition: A | Discharge status: Home / Self Care | Payer: Medicare Other | Source: Ambulatory Visit | Attending: Physician Assistant | Admitting: Physician Assistant

## 2020-10-22 ENCOUNTER — Other Ambulatory Visit: Payer: Self-pay

## 2020-10-22 DIAGNOSIS — M25551 Pain in right hip: Secondary | ICD-10-CM | POA: Insufficient documentation

## 2020-10-22 DIAGNOSIS — M1611 Unilateral primary osteoarthritis, right hip: Secondary | ICD-10-CM | POA: Diagnosis not present

## 2020-10-24 ENCOUNTER — Ambulatory Visit: Payer: Medicare Other | Admitting: Physician Assistant

## 2020-10-24 ENCOUNTER — Encounter: Payer: Self-pay | Admitting: Physician Assistant

## 2020-10-24 DIAGNOSIS — M25551 Pain in right hip: Secondary | ICD-10-CM | POA: Diagnosis not present

## 2020-10-24 MED ORDER — TRAMADOL HCL 50 MG PO TABS: 50.0000 mg | ORAL_TABLET | Freq: Four times a day (QID) | ORAL | 0 refills | Status: DC | PRN

## 2020-10-24 NOTE — Progress Notes (Addendum)
HPI: Jessica [name] comes in today to review the MRI of her right hip.  She states her right hip pain is overall getting worse.  She is taking Aleve for pain she is ambulating with a cane.  She denies any recent fevers chills. MRI right hip images reviewed with the patient today.  MRI right hip dated 10/19/2020 shows patchy marrow edema right femoral head neck and intertrochanteric region.  Articular cartilage there is partial thickness cartilage loss of both hips.  Patchy edema is seen in the right abductor muscles.  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  Radiologist did note that given her history of breast cancer and T1 marrow Heiko intensity involving the right femoral head neck and intertrochanteric region that this could represent an underlying marrow replacing lesion such as metastasis.  And could represent a superimposed pathologic stress fracture. MRI images were reviewed with Dr. Marlou Sa today as Dr. Ninfa Linden was out for the day.  I did discuss with the patient that Dr. 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  Physical exam: She is unable to tolerate internal and external rotation of the right hip secondary to pain.  She is able to flex the right hip though.  Impression: Right hip pain Differential stress fracture with avascular necrosis versus metastatic lesion with pathologic fracture  Plan: Would like patient to be nonweightbearing on the right hip is much as possible she is given a prescription for a rolling walker.  Did discuss with her possible right total hip arthroplasty handout was given risk benefits of surgery discussed with her.  Discussed with her that I would reviewed the MRI with Dr. Ninfa Linden tomorrow and that 1 of [place] would call her with Dr. Trevor Mace recommendations.  Questions were encouraged and answered at length.   Addendum:  Spoke with Dr. Ninfa Linden he would like to get a nuclear medicine whole-body scan to rule out metastasis.  We will also refer her back to oncology here in [facility] for evaluation for possible metastasis to right femur.  Patient was called and plan was discussed with her.  She did asked to see a different oncologist that she has seen in the past.  She states she does not want to go back to see Dr. Jana Hakim, there was no reason given.

## 2020-10-25 ENCOUNTER — Other Ambulatory Visit: Payer: Self-pay

## 2020-10-25 DIAGNOSIS — M25551 Pain in right hip: Secondary | ICD-10-CM

## 2020-10-25 DIAGNOSIS — M7061 Trochanteric bursitis, right hip: Secondary | ICD-10-CM

## 2020-11-03 ENCOUNTER — Telehealth: Payer: Self-pay | Admitting: Hematology and Oncology

## 2020-11-03 NOTE — Telephone Encounter (Signed)
Received a new pt referral from Dr. Ninfa Linden at [facility] for metastatic cancer. Jessica [name] is a former pt of Dr. Jana Hakim but wanted to change providers. She has been cld and scheduled to see Dr. Lindi Adie on 1/25 at 345pm. Pt aware to arrive 15 minutes early.

## 2020-11-04 ENCOUNTER — Encounter (HOSPITAL_COMMUNITY)
Admission: RE | Admit: 2020-11-04 | Discharge: 2020-11-04 | Disposition: A | Discharge status: Home / Self Care | Payer: Medicare Other | Source: Ambulatory Visit | Attending: Orthopaedic Surgery | Admitting: Orthopaedic Surgery

## 2020-11-04 ENCOUNTER — Other Ambulatory Visit: Payer: Self-pay

## 2020-11-04 DIAGNOSIS — C801 Malignant (primary) neoplasm, unspecified: Secondary | ICD-10-CM | POA: Diagnosis not present

## 2020-11-04 DIAGNOSIS — M7061 Trochanteric bursitis, right hip: Secondary | ICD-10-CM | POA: Diagnosis not present

## 2020-11-04 DIAGNOSIS — M25551 Pain in right hip: Secondary | ICD-10-CM | POA: Insufficient documentation

## 2020-11-04 MED ORDER — TECHNETIUM TC 99M MEDRONATE IV KIT
20.0000 | PACK | Freq: Once | INTRAVENOUS | Status: AC | PRN
  Administered 2020-11-04: 20 via INTRAVENOUS

## 2020-11-07 NOTE — Progress Notes (Signed)
[facility] CONSULT NOTE  Patient Care Team: Patient, No Pcp Per as PCP - General (General Practice) Magrinat, [name], MD as Consulting Physician (Oncology) [name], MD as Consulting Physician (Dermatology)  CHIEF COMPLAINTS/PURPOSE OF CONSULTATION:  Newly diagnosed breast cancer  HISTORY OF PRESENTING ILLNESS:  Jessica [name] 86 y.o. female is here because of recent diagnosis of metastatic breast cancer, originally diagnosed in 2017. She was seen in dermatology for a skin lesion on her right breast, biopsy of which showed a complex sclerosing lesion with atypical ductal hyperplasia. She underwent a right lumpectomy with Dr. Ninfa Linden on 03/20/16 for which pathology showed invasive ductal carcinoma, ER/PR+, HER-2 negative. She refused Oncotype testing, and given her age, adjuvant chemotherapy was not recommended. She refused radiation. She was started on antiestrogen therapy with tamoxifen on 07/17/16 after she could not tolerate anastrozole prior to undergoing her surgery.   She was seen by Dr. Ninfa Linden on 05/24/20 for right hip pain and initially underwent x-rays and injections. MRI on 10/23/20 after continued pain showed non mass-like hypointensity involving the right femoral head, neck, and intertrochanteric region possibly representing metastasis with superimposed pathologic stress fracture. Bone scan on 11/04/20 showed suspected lesions involving the lateral right third rib and the posterior right fourth rib. She has previously been seen by Dr. Jana Hakim, last 07/17/16, but wishes to change providers. She presents to the clinic today for initial evaluation.   I reviewed her records extensively and collaborated the history with the patient.  SUMMARY OF ONCOLOGIC HISTORY: Oncology History  Breast cancer of lower-inner quadrant of right female breast ([place])  01/16/2016 Initial Biopsy   Skin biopsy done by Dr. Denna Haggard found metastatic carcinoma ER/PR +.   02/02/2016 Initial  Diagnosis   Breast cancer of lower-inner quadrant of right female breast ([place])   02/02/2016 - 04/10/2016 Neo-Adjuvant Anti-estrogen oral therapy   Anastrozole 22m daily.   02/10/2016 Procedure   CT chest negative for metastatic disease Bone scan negative for metastatic disease   02/14/2016 Mammogram   1.7cm irregular mass in right lower inner quadrant corresponding with known breast cancer skin biopsy.   02/16/2016 Breast MRI   1. Biopsy proven cancer in lower inner quadrant of right breast: 2.8x1.7x1cm, extending to skin surface.  No other evidence of disease in right breast, left breast, or abnormal lymph nodes within the axillary or internal mammary chain regions.   2. Outside ultrasound report recommended additional ultrasound guided biopsy for hypoechoic mass in right breast at 9 o'clock suspected to be intramammary node.     02/22/2016 Procedure   Biopsy of 9 o'clock mass: complex sclerosing lesion with atypical ductal hyperplasia.     03/20/2016 Oncotype testing   Declined oncotype testing   03/20/2016 Surgery   1. Right lumpectomy (9 o'clock lesion): atypical ductal hyperplasia 2. Right partial mastectomy: IDC, grade 1, ER+(100%), PR(15%), Ki-67 5%, HER-2 neg (ratio 1.40), 2.5 cm, with atypical ductal hyperplasia, lymphovascular invasion, margins negative.   04/05/2016 -  Radiation Therapy   Declined radiation therapy   07/17/2016 -  Anti-estrogen oral therapy   Started Tamoxifen 252mdaily.  A total of 5 years of therapy planned.   10/23/2020 Relapse/Recurrence   Patient has persistent right hip pain. MRI on 10/23/20 after continued pain showed non mass-like hypointensity involving the right femoral head, neck, and intertrochanteric region possibly representing metastasis with superimposed pathologic stress fracture. Bone scan on 11/04/20 showed suspected lesions involving the lateral right third rib and the posterior right fourth rib.  MEDICAL HISTORY:  Past Medical History:   Diagnosis Date   Breast cancer ([place])    rt breast   Nephrolithiasis     SURGICAL HISTORY: Past Surgical History:  Procedure Laterality Date   ABDOMINAL HYSTERECTOMY     APPENDECTOMY     BREAST LUMPECTOMY WITH RADIOACTIVE SEED LOCALIZATION Right 03/20/2016   Procedure: RIGHT BREAST LUMPECTOMY WITH RADIOACTIVE SEED LOCALIZATION, AND RIGHT BREAST PARTIAL MASTECTOMY;  Surgeon: Coralie Keens, MD;  Location: [facility];  Service: General;  Laterality: Right;   BREAST SURGERY Left    breast lumpectomy   CATARACT EXTRACTION, BILATERAL     TOTAL ABDOMINAL HYSTERECTOMY W/ BILATERAL SALPINGOOPHORECTOMY     WRIST FRACTURE SURGERY Right     SOCIAL HISTORY: Social History   Socioeconomic History   Marital status: Widowed    Spouse name: Not on file   Number of children: Not on file   Years of education: Not on file   Highest education level: Not on file  Occupational History   Not on file  Tobacco Use   Smoking status: Former Smoker    Quit date: 02/02/2004    Years since quitting: 16.7   Smokeless tobacco: Not on file  Substance and Sexual Activity   Alcohol use: No    Alcohol/week: 0.0 standard drinks   Drug use: No   Sexual activity: Not on file  Other Topics Concern   Not on file  Social History Narrative   Not on file   Social Determinants of Health   Financial Resource Strain: Not on file  Food Insecurity: Not on file  Transportation Needs: Not on file  Physical Activity: Not on file  Stress: Not on file  Social Connections: Not on file  Intimate Partner Violence: Not on file    FAMILY HISTORY: No family history on file.  ALLERGIES:  has No Known Allergies.  MEDICATIONS:  Current Outpatient Medications  Medication Sig Dispense Refill   HYDROcodone-acetaminophen (NORCO/VICODIN) 5-325 MG tablet Take 1 tablet by mouth every 6 (six) hours as needed for moderate pain. 60 tablet 0   ibuprofen (ADVIL,MOTRIN) 200 MG tablet Take  200 mg by mouth every 6 (six) hours as needed.     magnesium hydroxide (MILK OF MAGNESIA) 400 MG/5ML suspension Take by mouth daily as needed for mild constipation.     No current facility-administered medications for this visit.    REVIEW OF SYSTEMS:   Intractable pain related to the hip  PHYSICAL EXAMINATION: ECOG PERFORMANCE STATUS: 3 - Symptomatic, >50% confined to bed  Vitals:   11/08/20 1546  BP: (!) 146/66  Pulse: 84  Resp: 17  Temp: 98.1 F (36.7 C)  SpO2: 95%   Filed Weights   11/08/20 1546  Weight: 149 lb 4.8 oz (67.7 kg)      LABORATORY DATA:  I have reviewed the data as listed Lab Results  Component Value Date   WBC 5.2 10/30/2018   HGB 14.2 10/30/2018   HCT 42.6 10/30/2018   MCV 85.9 10/30/2018   PLT 228 10/30/2018   Lab Results  Component Value Date   NA 146 10/30/2018   K 4.7 10/30/2018   CL 106 10/30/2018   CO2 29 10/30/2018    RADIOGRAPHIC STUDIES: I have personally reviewed the radiological reports and agreed with the findings in the report.  ASSESSMENT AND PLAN:  Breast cancer of lower-inner quadrant of right female breast [facility]) Metastatic breast cancer MRI right hip 10/23/2020: Stress fracture and suspected early avascular necrosis versus  metastatic cancer.  Patchy marrow edema in the right femoral head neck and intertrochanteric region.  Bone scan 11/04/2020: Suspected metastatic lesions involving lateral right third rib and posterior right fourth rib.  Increased tracer uptake right femoral head and neck and intertrochanteric region corresponding to stress fracture and early AVN  Counseling: I discussed with the patient that her original diagnosis seemed extremely favorable and therefore I am surprised that she has these abnormalities.  Therefore we need to prove beyond imaging to confirm that this is truly metastatic disease.  Recommendation: 1.  CT CAP 2. I discussed with Dr. Ninfa Linden regarding her options and both of [place] are in agreement  that she will need right hip replacement surgery.  This way we can obtain tissue for pathology evaluation.  She is in intractable pain and this will help with the pain issues.  Severe pain in the hip as soon as she moves: She tells me the tramadol is barely touching it.  I sent a prescription for Percocet with strict instructions that she needs to take it when there is someone around her so that she does not get drowsy or dizzy or instability  in the feet.  I also warned her about constipation as a side effect.  She will only take half a tablet and see how she tolerates it.  I will call her son with the results of her CT scan. I will see her back after hip surgery is done.   All questions were answered. The patient knows to call the clinic with any problems, questions or concerns.    Rulon Eisenmenger, MD, MPH 11/08/2020    I, Molly Dorshimer, am acting as scribe for Nicholas Lose, MD.  I have reviewed the above documentation for accuracy and completeness, and I agree with the above.

## 2020-11-08 ENCOUNTER — Other Ambulatory Visit: Payer: Self-pay

## 2020-11-08 ENCOUNTER — Inpatient Hospital Stay: Payer: Medicare Other | Attending: Hematology and Oncology | Admitting: Hematology and Oncology

## 2020-11-08 DIAGNOSIS — C50311 Malignant neoplasm of lower-inner quadrant of right female breast: Secondary | ICD-10-CM | POA: Diagnosis not present

## 2020-11-08 DIAGNOSIS — Z170 Estrogen receptor positive status [ER+]: Secondary | ICD-10-CM | POA: Insufficient documentation

## 2020-11-08 DIAGNOSIS — Z79899 Other long term (current) drug therapy: Secondary | ICD-10-CM | POA: Diagnosis not present

## 2020-11-08 DIAGNOSIS — M25551 Pain in right hip: Secondary | ICD-10-CM | POA: Insufficient documentation

## 2020-11-08 DIAGNOSIS — Z79810 Long term (current) use of selective estrogen receptor modulators (SERMs): Secondary | ICD-10-CM | POA: Diagnosis not present

## 2020-11-08 DIAGNOSIS — Z87891 Personal history of nicotine dependence: Secondary | ICD-10-CM | POA: Diagnosis not present

## 2020-11-08 DIAGNOSIS — Z923 Personal history of irradiation: Secondary | ICD-10-CM | POA: Insufficient documentation

## 2020-11-08 DIAGNOSIS — Z791 Long term (current) use of non-steroidal anti-inflammatories (NSAID): Secondary | ICD-10-CM | POA: Diagnosis not present

## 2020-11-08 MED ORDER — HYDROCODONE-ACETAMINOPHEN 5-325 MG PO TABS: 1.0000 | ORAL_TABLET | Freq: Four times a day (QID) | ORAL | 0 refills | Status: DC | PRN

## 2020-11-08 NOTE — Assessment & Plan Note (Addendum)
Metastatic breast cancer MRI right hip 10/23/2020: Stress fracture and suspected early avascular necrosis versus metastatic cancer.  Patchy marrow edema in the right femoral head neck and intertrochanteric region.  Bone scan 11/04/2020: Suspected metastatic lesions involving lateral right third rib and posterior right fourth rib.  Increased tracer uptake right femoral head and neck and intertrochanteric region corresponding to stress fracture and early AVN  Counseling: I discussed with the patient that her original diagnosis seemed extremely favorable and therefore I am surprised that she has these abnormalities.  Therefore we need to prove beyond imaging to confirm that this is truly metastatic disease.  Recommendation: 1.  CT CAP 2. I discussed with Dr. Ninfa Linden regarding her options and both of [place] are in agreement that she will need right hip replacement surgery.  This way we can obtain tissue for pathology evaluation.  She is in intractable pain and this will help with the pain issues.  Severe pain in the hip as soon as she moves: She tells me the tramadol is barely touching it.  I sent a prescription for Percocet with strict instructions that she needs to take it when there is someone around her so that she does not get drowsy or dizzy or instability  in the feet.  I also warned her about constipation as a side effect.  She will only take half a tablet and see how she tolerates it.  I will call her son with the results of her CT scan. I will see her back after hip surgery is done.

## 2020-11-09 ENCOUNTER — Telehealth: Payer: Self-pay | Admitting: Hematology and Oncology

## 2020-11-09 NOTE — Telephone Encounter (Signed)
No 1/25 los. No changes made to pt's schedule.  

## 2020-11-10 ENCOUNTER — Telehealth: Payer: Self-pay

## 2020-11-10 NOTE — Telephone Encounter (Signed)
I have spoken to the patient's oncologist and reviewed the MRI of her right hip.  She unfortunately does have avascular necrosis with a stress fracture in the hip and now an impending pathologic fracture.  She is in need of a right hip replacement sooner than later.  This would be 1 there is considered more urgent and needs to be done at [facility] with definitely staying overnight.  She needs to be scheduled sometime in the next 2 weeks.

## 2020-11-10 NOTE — Telephone Encounter (Signed)
Son called wanting to schedule THA.  Please advise.  Thanks!

## 2020-11-15 ENCOUNTER — Encounter: Payer: Self-pay | Admitting: Licensed Clinical Social Worker

## 2020-11-15 NOTE — Telephone Encounter (Signed)
I called son and scheduled for 11/22/20.

## 2020-11-15 NOTE — Progress Notes (Signed)
[place] Work  Clinical Social Work was referred by new patient protocol (new metastatic diagnosis) for assessment of psychosocial needs.  Clinical Social Worker contacted patient by phone  to offer support and assess for needs.  Patient lives by herself but reports that she has good support from her son who also drives her to appointments and does not have issues with food, etc.  CSW introduced other support services should patient need or want to access them in the future.   Pt's biggest concern right now is having her hip replacement done as soon as possible since she is in significant pain (see other notes in chart re: status of this).   Patient may call CSW as needed should any support or resource needs arise.   [place], [place] Worker [employer]

## 2020-11-16 ENCOUNTER — Other Ambulatory Visit: Payer: Self-pay

## 2020-11-16 ENCOUNTER — Other Ambulatory Visit: Payer: Self-pay | Admitting: Physician Assistant

## 2020-11-16 NOTE — Progress Notes (Signed)
Need orders in epic preop on 2/4 surgery on 2/8

## 2020-11-17 ENCOUNTER — Ambulatory Visit (HOSPITAL_COMMUNITY)
Admission: RE | Admit: 2020-11-17 | Discharge: 2020-11-17 | Disposition: A | Discharge status: Home / Self Care | Payer: Medicare Other | Source: Ambulatory Visit | Attending: Hematology and Oncology | Admitting: Hematology and Oncology

## 2020-11-17 ENCOUNTER — Other Ambulatory Visit: Payer: Self-pay

## 2020-11-17 DIAGNOSIS — K5730 Diverticulosis of large intestine without perforation or abscess without bleeding: Secondary | ICD-10-CM | POA: Diagnosis not present

## 2020-11-17 DIAGNOSIS — C50311 Malignant neoplasm of lower-inner quadrant of right female breast: Secondary | ICD-10-CM | POA: Diagnosis not present

## 2020-11-17 DIAGNOSIS — I700 Atherosclerosis of aorta: Secondary | ICD-10-CM | POA: Diagnosis not present

## 2020-11-17 DIAGNOSIS — Z170 Estrogen receptor positive status [ER+]: Secondary | ICD-10-CM | POA: Diagnosis not present

## 2020-11-17 DIAGNOSIS — C7951 Secondary malignant neoplasm of bone: Secondary | ICD-10-CM | POA: Diagnosis not present

## 2020-11-17 DIAGNOSIS — Z853 Personal history of malignant neoplasm of breast: Secondary | ICD-10-CM | POA: Diagnosis not present

## 2020-11-17 LAB — POCT I-STAT CREATININE: Creatinine, Ser: 0.7 mg/dL (ref 0.44–1.00)

## 2020-11-17 MED ORDER — IOHEXOL 300 MG/ML  SOLN
100.0000 mL | Freq: Once | INTRAMUSCULAR | Status: AC | PRN
  Administered 2020-11-17: 100 mL via INTRAVENOUS

## 2020-11-18 ENCOUNTER — Encounter (HOSPITAL_COMMUNITY)
Admission: RE | Admit: 2020-11-18 | Discharge: 2020-11-18 | Disposition: A | Discharge status: Home / Self Care | Payer: Medicare Other | Source: Ambulatory Visit | Attending: Orthopaedic Surgery | Admitting: Orthopaedic Surgery

## 2020-11-18 ENCOUNTER — Other Ambulatory Visit: Payer: Self-pay

## 2020-11-18 ENCOUNTER — Other Ambulatory Visit: Payer: Self-pay | Admitting: Physician Assistant

## 2020-11-18 ENCOUNTER — Other Ambulatory Visit (HOSPITAL_COMMUNITY)
Admission: RE | Admit: 2020-11-18 | Discharge: 2020-11-18 | Disposition: A | Discharge status: Home / Self Care | Payer: Medicare Other | Source: Ambulatory Visit | Attending: Orthopaedic Surgery | Admitting: Orthopaedic Surgery

## 2020-11-18 ENCOUNTER — Encounter (HOSPITAL_COMMUNITY): Payer: Self-pay

## 2020-11-18 DIAGNOSIS — Z01812 Encounter for preprocedural laboratory examination: Secondary | ICD-10-CM | POA: Insufficient documentation

## 2020-11-18 DIAGNOSIS — Z20822 Contact with and (suspected) exposure to covid-19: Secondary | ICD-10-CM | POA: Diagnosis not present

## 2020-11-18 HISTORY — DX: Personal history of urinary calculi: Z87.442

## 2020-11-18 HISTORY — DX: Unspecified osteoarthritis, unspecified site: M19.90

## 2020-11-18 LAB — COMPREHENSIVE METABOLIC PANEL
ALT: 16 U/L (ref 0–44)
AST: 16 U/L (ref 15–41)
Albumin: 3.7 g/dL (ref 3.5–5.0)
Alkaline Phosphatase: 80 U/L (ref 38–126)
Anion gap: 8 (ref 5–15)
BUN: 16 mg/dL (ref 8–23)
CO2: 29 mmol/L (ref 22–32)
Calcium: 9.2 mg/dL (ref 8.9–10.3)
Chloride: 106 mmol/L (ref 98–111)
Creatinine, Ser: 1 mg/dL (ref 0.44–1.00)
GFR, Estimated: 56 mL/min — ABNORMAL LOW (ref >60)
Glucose, Bld: 98 mg/dL (ref 70–99)
Potassium: 4.7 mmol/L (ref 3.5–5.1)
Sodium: 143 mmol/L (ref 135–145)
Total Bilirubin: 0.6 mg/dL (ref 0.3–1.2)
Total Protein: 7 g/dL (ref 6.5–8.1)

## 2020-11-18 LAB — CBC
HCT: 43 % (ref 36.0–46.0)
Hemoglobin: 13.9 g/dL (ref 12.0–15.0)
MCH: 29 pg (ref 26.0–34.0)
MCHC: 32.3 g/dL (ref 30.0–36.0)
MCV: 89.6 fL (ref 80.0–100.0)
Platelets: 228 10*3/uL (ref 150–400)
RBC: 4.8 MIL/uL (ref 3.87–5.11)
RDW: 13.1 % (ref 11.5–15.5)
WBC: 6.9 10*3/uL (ref 4.0–10.5)
nRBC: 0 % (ref 0.0–0.2)

## 2020-11-18 LAB — SURGICAL PCR SCREEN
MRSA, PCR: NEGATIVE
Staphylococcus aureus: NEGATIVE

## 2020-11-18 LAB — SARS CORONAVIRUS 2 (TAT 6-24 HRS): SARS Coronavirus 2: NEGATIVE

## 2020-11-18 NOTE — Progress Notes (Signed)
DUE TO COVID-19 ONLY ONE VISITOR IS ALLOWED TO COME WITH YOU AND STAY IN THE WAITING ROOM ONLY DURING PRE OP AND PROCEDURE DAY OF SURGERY. THE 1 VISITOR  MAY VISIT WITH YOU AFTER SURGERY IN YOUR PRIVATE ROOM DURING VISITING HOURS ONLY!  YOU NEED TO HAVE A COVID 19 TEST ON_2/01/2021 _____ @_______ , THIS TEST MUST BE DONE BEFORE SURGERY,  COVID TESTING SITE [address] [place] [id number], IT IS ON THE RIGHT GOING OUT [address] APPROXIMATELY  2 MINUTES PAST [employer] ON THE RIGHT. ONCE YOUR COVID TEST IS COMPLETED,  PLEASE BEGIN THE QUARANTINE INSTRUCTIONS AS OUTLINED IN YOUR HANDOUT.                Jessica [name]  11/18/2020   Your procedure is scheduled on: 11/22/2020    Report to [facility] Main  Entrance   Report to admitting at     1250pm     Call this number if you have problems the morning of surgery [phone]    Remember: Do not eat food , candy gum or mints :After Midnight. You may have clear liquids from midnight until 1220pm    CLEAR LIQUID DIET   Foods Allowed                                                                       Coffee and tea, regular and decaf                              Plain Jell-O any favor except red or purple                                            Fruit ices (not with fruit pulp)                                      Iced Popsicles                                     Carbonated beverages, regular and diet                                    Cranberry, grape and apple juices Sports drinks like Gatorade Lightly seasoned clear broth or consume(fat free) Sugar, honey syrup   _____________________________________________________________________    BRUSH YOUR TEETH MORNING OF SURGERY AND RINSE YOUR MOUTH OUT, NO CHEWING GUM CANDY OR MINTS.     Take these medicines the morning of surgery with A SIP OF WATER: none   DO NOT TAKE ANY DIABETIC MEDICATIONS DAY OF YOUR SURGERY                                You may not have any metal on your body  including hair pins and              piercings  Do not wear jewelry, make-up, lotions, powders or perfumes, deodorant             Do not wear nail polish on your fingernails.  Do not shave  48 hours prior to surgery.              Men may shave face and neck.   Do not bring valuables to the hospital. [place].  Contacts, dentures or bridgework may not be worn into surgery.  Leave suitcase in the car. After surgery it may be brought to your room.     Patients discharged the day of surgery will not be allowed to drive home. IF YOU ARE HAVING SURGERY AND GOING HOME THE SAME DAY, YOU MUST HAVE AN ADULT TO DRIVE YOU HOME AND BE WITH YOU FOR 24 HOURS. YOU MAY GO HOME BY TAXI OR UBER OR ORTHERWISE, BUT AN ADULT MUST ACCOMPANY YOU HOME AND STAY WITH YOU FOR 24 HOURS.  Name and phone number of your driver:  Special Instructions: N/A              Please read over the following fact sheets you were given: _____________________________________________________________________  [facility] - Preparing for Surgery Before surgery, you can play an important role.  Because skin is not sterile, your skin needs to be as free of germs as possible.  You can reduce the number of germs on your skin by washing with CHG (chlorahexidine gluconate) soap before surgery.  CHG is an antiseptic cleaner which kills germs and bonds with the skin to continue killing germs even after washing. Please DO NOT use if you have an allergy to CHG or antibacterial soaps.  If your skin becomes reddened/irritated stop using the CHG and inform your nurse when you arrive at Short Stay. Do not shave (including legs and underarms) for at least 48 hours prior to the first CHG shower.  You may shave your face/neck. Please follow these instructions carefully:  1.  Shower with CHG Soap the night before surgery and the  morning of Surgery.  2.  If you choose  to wash your hair, wash your hair first as usual with your  normal  shampoo.  3.  After you shampoo, rinse your hair and body thoroughly to remove the  shampoo.                           4.  Use CHG as you would any other liquid soap.  You can apply chg directly  to the skin and wash                       Gently with a scrungie or clean washcloth.  5.  Apply the CHG Soap to your body ONLY FROM THE NECK DOWN.   Do not use on face/ open                           Wound or open sores. Avoid contact with eyes, ears mouth and genitals (private parts).                       Wash face,  Development worker, international aid (private parts)  with your normal soap.             6.  Wash thoroughly, paying special attention to the area where your surgery  will be performed.  7.  Thoroughly rinse your body with warm water from the neck down.  8.  DO NOT shower/wash with your normal soap after using and rinsing off  the CHG Soap.                9.  Pat yourself dry with a clean towel.            10.  Wear clean pajamas.            11.  Place clean sheets on your bed the night of your first shower and do not  sleep with pets. Day of Surgery : Do not apply any lotions/deodorants the morning of surgery.  Please wear clean clothes to the hospital/surgery center.  FAILURE TO FOLLOW THESE INSTRUCTIONS MAY RESULT IN THE CANCELLATION OF YOUR SURGERY PATIENT SIGNATURE_________________________________  NURSE SIGNATURE__________________________________  ________________________________________________________________________

## 2020-11-22 ENCOUNTER — Ambulatory Visit (HOSPITAL_COMMUNITY): Payer: Medicare Other | Admitting: Physician Assistant

## 2020-11-22 ENCOUNTER — Encounter (HOSPITAL_COMMUNITY)
Admission: RE | Disposition: A | Discharge status: Home / Self Care | Payer: Self-pay | Source: Home / Self Care | Attending: Orthopaedic Surgery

## 2020-11-22 ENCOUNTER — Observation Stay (HOSPITAL_COMMUNITY)
Admission: RE | Admit: 2020-11-22 | Discharge: 2020-11-24 | Disposition: A | Discharge status: Home / Self Care | Payer: Medicare Other | Attending: Orthopaedic Surgery | Admitting: Orthopaedic Surgery

## 2020-11-22 ENCOUNTER — Encounter (HOSPITAL_COMMUNITY): Payer: Self-pay | Admitting: Orthopaedic Surgery

## 2020-11-22 ENCOUNTER — Telehealth: Payer: Self-pay | Admitting: Hematology and Oncology

## 2020-11-22 ENCOUNTER — Ambulatory Visit (HOSPITAL_COMMUNITY): Payer: Medicare Other

## 2020-11-22 ENCOUNTER — Other Ambulatory Visit: Payer: Self-pay

## 2020-11-22 ENCOUNTER — Observation Stay (HOSPITAL_COMMUNITY): Payer: Medicare Other

## 2020-11-22 DIAGNOSIS — Z79899 Other long term (current) drug therapy: Secondary | ICD-10-CM | POA: Insufficient documentation

## 2020-11-22 DIAGNOSIS — Z471 Aftercare following joint replacement surgery: Secondary | ICD-10-CM | POA: Diagnosis not present

## 2020-11-22 DIAGNOSIS — Z419 Encounter for procedure for purposes other than remedying health state, unspecified: Secondary | ICD-10-CM

## 2020-11-22 DIAGNOSIS — Z96641 Presence of right artificial hip joint: Secondary | ICD-10-CM | POA: Diagnosis not present

## 2020-11-22 DIAGNOSIS — M87051 Idiopathic aseptic necrosis of right femur: Principal | ICD-10-CM

## 2020-11-22 DIAGNOSIS — Z87891 Personal history of nicotine dependence: Secondary | ICD-10-CM | POA: Diagnosis not present

## 2020-11-22 DIAGNOSIS — Z853 Personal history of malignant neoplasm of breast: Secondary | ICD-10-CM | POA: Insufficient documentation

## 2020-11-22 DIAGNOSIS — M25551 Pain in right hip: Secondary | ICD-10-CM | POA: Diagnosis present

## 2020-11-22 DIAGNOSIS — C7951 Secondary malignant neoplasm of bone: Secondary | ICD-10-CM | POA: Diagnosis not present

## 2020-11-22 DIAGNOSIS — Z87442 Personal history of urinary calculi: Secondary | ICD-10-CM | POA: Diagnosis not present

## 2020-11-22 DIAGNOSIS — Z791 Long term (current) use of non-steroidal anti-inflammatories (NSAID): Secondary | ICD-10-CM | POA: Insufficient documentation

## 2020-11-22 DIAGNOSIS — M1990 Unspecified osteoarthritis, unspecified site: Secondary | ICD-10-CM | POA: Diagnosis not present

## 2020-11-22 DIAGNOSIS — Z9889 Other specified postprocedural states: Secondary | ICD-10-CM | POA: Diagnosis not present

## 2020-11-22 DIAGNOSIS — C801 Malignant (primary) neoplasm, unspecified: Secondary | ICD-10-CM | POA: Diagnosis not present

## 2020-11-22 HISTORY — PX: TOTAL HIP ARTHROPLASTY: SHX124

## 2020-11-22 LAB — TYPE AND SCREEN
ABO/RH(D): A NEG
Antibody Screen: NEGATIVE

## 2020-11-22 LAB — ABO/RH: ABO/RH(D): A NEG

## 2020-11-22 SURGERY — ARTHROPLASTY, HIP, TOTAL, ANTERIOR APPROACH
Anesthesia: Monitor Anesthesia Care | Site: Hip | Laterality: Right

## 2020-11-22 MED ORDER — STERILE WATER FOR IRRIGATION IR SOLN
Status: DC | PRN
  Administered 2020-11-22: 2000 mL

## 2020-11-22 MED ORDER — ORAL CARE MOUTH RINSE: 15.0000 mL | Freq: Once | OROMUCOSAL | Status: AC

## 2020-11-22 MED ORDER — PHENOL 1.4 % MT LIQD: 1.0000 | OROMUCOSAL | Status: DC | PRN

## 2020-11-22 MED ORDER — SODIUM CHLORIDE 0.9 % IV SOLN: INTRAVENOUS | Status: DC

## 2020-11-22 MED ORDER — OXYCODONE HCL 5 MG PO TABS: 5.0000 mg | ORAL_TABLET | Freq: Once | ORAL | Status: DC | PRN

## 2020-11-22 MED ORDER — ACETAMINOPHEN 500 MG PO TABS
1000.0000 mg | ORAL_TABLET | Freq: Once | ORAL | Status: AC
  Administered 2020-11-22: 1000 mg via ORAL
  Filled 2020-11-22: qty 2

## 2020-11-22 MED ORDER — LIDOCAINE HCL (CARDIAC) PF 100 MG/5ML IV SOSY
PREFILLED_SYRINGE | INTRAVENOUS | Status: DC | PRN
  Administered 2020-11-22: 40 mg via INTRAVENOUS

## 2020-11-22 MED ORDER — METOCLOPRAMIDE HCL 5 MG PO TABS
5.0000 mg | ORAL_TABLET | Freq: Three times a day (TID) | ORAL | Status: DC | PRN
Start: 2020-11-22 — End: 2020-11-24

## 2020-11-22 MED ORDER — BUPIVACAINE IN DEXTROSE 0.75-8.25 % IT SOLN
INTRATHECAL | Status: DC | PRN
  Administered 2020-11-22: 1.8 mL via INTRATHECAL

## 2020-11-22 MED ORDER — MORPHINE SULFATE (PF) 2 MG/ML IV SOLN
0.5000 mg | INTRAVENOUS | Status: DC | PRN
Start: 2020-11-22 — End: 2020-11-22
  Administered 2020-11-22: 1 mg via INTRAVENOUS
  Filled 2020-11-22 (×2): qty 1

## 2020-11-22 MED ORDER — ONDANSETRON HCL 4 MG/2ML IJ SOLN: 4.0000 mg | Freq: Once | INTRAMUSCULAR | Status: DC | PRN

## 2020-11-22 MED ORDER — TRAMADOL HCL 50 MG PO TABS
50.0000 mg | ORAL_TABLET | Freq: Four times a day (QID) | ORAL | Status: DC
  Administered 2020-11-22 – 2020-11-24 (×7): 50 mg via ORAL
  Filled 2020-11-22 (×8): qty 1

## 2020-11-22 MED ORDER — CEFAZOLIN SODIUM-DEXTROSE 2-4 GM/100ML-% IV SOLN
2.0000 g | INTRAVENOUS | Status: AC
  Administered 2020-11-22: 2 g via INTRAVENOUS
  Filled 2020-11-22: qty 100

## 2020-11-22 MED ORDER — HYDROMORPHONE HCL 1 MG/ML IJ SOLN
0.5000 mg | INTRAMUSCULAR | Status: DC | PRN
  Administered 2020-11-22: 0.5 mg via INTRAVENOUS
  Filled 2020-11-22: qty 1

## 2020-11-22 MED ORDER — PROPOFOL 10 MG/ML IV BOLUS
INTRAVENOUS | Status: DC | PRN
  Administered 2020-11-22: 20 mg via INTRAVENOUS

## 2020-11-22 MED ORDER — MENTHOL 3 MG MT LOZG: 1.0000 | LOZENGE | OROMUCOSAL | Status: DC | PRN

## 2020-11-22 MED ORDER — MEPERIDINE HCL 50 MG/ML IJ SOLN: 6.2500 mg | INTRAMUSCULAR | Status: DC | PRN

## 2020-11-22 MED ORDER — ACETAMINOPHEN 325 MG PO TABS: 325.0000 mg | ORAL_TABLET | Freq: Four times a day (QID) | ORAL | Status: DC | PRN

## 2020-11-22 MED ORDER — PHENYLEPHRINE 40 MCG/ML (10ML) SYRINGE FOR IV PUSH (FOR BLOOD PRESSURE SUPPORT)
PREFILLED_SYRINGE | INTRAVENOUS | Status: DC | PRN
  Administered 2020-11-22 (×3): 80 ug via INTRAVENOUS

## 2020-11-22 MED ORDER — CEFAZOLIN SODIUM-DEXTROSE 1-4 GM/50ML-% IV SOLN
1.0000 g | Freq: Four times a day (QID) | INTRAVENOUS | Status: AC
  Administered 2020-11-22 – 2020-11-23 (×2): 1 g via INTRAVENOUS
  Filled 2020-11-22 (×2): qty 50

## 2020-11-22 MED ORDER — 0.9 % SODIUM CHLORIDE (POUR BTL) OPTIME
TOPICAL | Status: DC | PRN
  Administered 2020-11-22: 1000 mL

## 2020-11-22 MED ORDER — ASPIRIN 81 MG PO CHEW
81.0000 mg | CHEWABLE_TABLET | Freq: Two times a day (BID) | ORAL | Status: DC
  Administered 2020-11-22 – 2020-11-23 (×3): 81 mg via ORAL
  Filled 2020-11-22 (×4): qty 1

## 2020-11-22 MED ORDER — HYDROCODONE-ACETAMINOPHEN 5-325 MG PO TABS
1.0000 | ORAL_TABLET | ORAL | Status: DC | PRN
  Administered 2020-11-23 (×2): 2 via ORAL
  Filled 2020-11-22 (×3): qty 2

## 2020-11-22 MED ORDER — POVIDONE-IODINE 10 % EX SWAB
2.0000 "application " | Freq: Once | CUTANEOUS | Status: AC
  Administered 2020-11-22: 2 via TOPICAL

## 2020-11-22 MED ORDER — HYDROCODONE-ACETAMINOPHEN 7.5-325 MG PO TABS
1.0000 | ORAL_TABLET | ORAL | Status: DC | PRN
Start: 2020-11-22 — End: 2020-11-24

## 2020-11-22 MED ORDER — ONDANSETRON HCL 4 MG/2ML IJ SOLN
4.0000 mg | Freq: Four times a day (QID) | INTRAMUSCULAR | Status: DC | PRN
  Administered 2020-11-22 – 2020-11-23 (×2): 4 mg via INTRAVENOUS
  Filled 2020-11-22 (×2): qty 2

## 2020-11-22 MED ORDER — ACETAMINOPHEN 160 MG/5ML PO SOLN: 325.0000 mg | ORAL | Status: DC | PRN

## 2020-11-22 MED ORDER — OXYCODONE HCL 5 MG/5ML PO SOLN: 5.0000 mg | Freq: Once | ORAL | Status: DC | PRN

## 2020-11-22 MED ORDER — FENTANYL CITRATE (PF) 100 MCG/2ML IJ SOLN: 25.0000 ug | INTRAMUSCULAR | Status: DC | PRN

## 2020-11-22 MED ORDER — DOCUSATE SODIUM 100 MG PO CAPS
100.0000 mg | ORAL_CAPSULE | Freq: Two times a day (BID) | ORAL | Status: DC
  Administered 2020-11-22 – 2020-11-23 (×3): 100 mg via ORAL
  Filled 2020-11-22 (×4): qty 1

## 2020-11-22 MED ORDER — ALUM & MAG HYDROXIDE-SIMETH 200-200-20 MG/5ML PO SUSP: 30.0000 mL | ORAL | Status: DC | PRN

## 2020-11-22 MED ORDER — METOCLOPRAMIDE HCL 5 MG/ML IJ SOLN: 5.0000 mg | Freq: Three times a day (TID) | INTRAMUSCULAR | Status: DC | PRN

## 2020-11-22 MED ORDER — PANTOPRAZOLE SODIUM 40 MG PO TBEC
40.0000 mg | DELAYED_RELEASE_TABLET | Freq: Every day | ORAL | Status: DC
  Administered 2020-11-22 – 2020-11-23 (×2): 40 mg via ORAL
  Filled 2020-11-22 (×3): qty 1

## 2020-11-22 MED ORDER — METHOCARBAMOL 1000 MG/10ML IJ SOLN
500.0000 mg | Freq: Four times a day (QID) | INTRAVENOUS | Status: DC | PRN
  Filled 2020-11-22: qty 5

## 2020-11-22 MED ORDER — PHENYLEPHRINE HCL-NACL 10-0.9 MG/250ML-% IV SOLN
INTRAVENOUS | Status: DC | PRN
  Administered 2020-11-22: 40 ug/min via INTRAVENOUS

## 2020-11-22 MED ORDER — METHOCARBAMOL 500 MG PO TABS
500.0000 mg | ORAL_TABLET | Freq: Four times a day (QID) | ORAL | Status: DC | PRN
  Administered 2020-11-22 – 2020-11-23 (×2): 500 mg via ORAL
  Filled 2020-11-22 (×2): qty 1

## 2020-11-22 MED ORDER — ALBUMIN HUMAN 5 % IV SOLN: INTRAVENOUS | Status: DC | PRN

## 2020-11-22 MED ORDER — DIPHENHYDRAMINE HCL 12.5 MG/5ML PO ELIX: 12.5000 mg | ORAL_SOLUTION | ORAL | Status: DC | PRN

## 2020-11-22 MED ORDER — PROPOFOL 500 MG/50ML IV EMUL
INTRAVENOUS | Status: DC | PRN
  Administered 2020-11-22: 75 ug/kg/min via INTRAVENOUS

## 2020-11-22 MED ORDER — ONDANSETRON HCL 4 MG PO TABS: 4.0000 mg | ORAL_TABLET | Freq: Four times a day (QID) | ORAL | Status: DC | PRN

## 2020-11-22 MED ORDER — ACETAMINOPHEN 325 MG PO TABS: 325.0000 mg | ORAL_TABLET | ORAL | Status: DC | PRN

## 2020-11-22 MED ORDER — LACTATED RINGERS IV SOLN: INTRAVENOUS | Status: DC

## 2020-11-22 MED ORDER — CHLORHEXIDINE GLUCONATE 0.12 % MT SOLN
15.0000 mL | Freq: Once | OROMUCOSAL | Status: AC
  Administered 2020-11-22: 15 mL via OROMUCOSAL

## 2020-11-22 MED ORDER — SODIUM CHLORIDE 0.9 % IR SOLN
Status: DC | PRN
  Administered 2020-11-22: 1000 mL

## 2020-11-22 SURGICAL SUPPLY — 40 items
APL SKNCLS STERI-STRIP NONHPOA (GAUZE/BANDAGES/DRESSINGS)
ARTICULEZE HEAD (Hips) ×2 IMPLANT
BAG SPEC THK2 15X12 ZIP CLS (MISCELLANEOUS)
BAG ZIPLOCK 12X15 (MISCELLANEOUS) IMPLANT
BENZOIN TINCTURE PRP APPL 2/3 (GAUZE/BANDAGES/DRESSINGS) IMPLANT
BLADE SAW SGTL 18X1.27X75 (BLADE) ×2 IMPLANT
COVER PERINEAL POST (MISCELLANEOUS) ×2 IMPLANT
COVER SURGICAL LIGHT HANDLE (MISCELLANEOUS) ×2 IMPLANT
COVER WAND RF STERILE (DRAPES) ×2 IMPLANT
DRAPE STERI IOBAN 125X83 (DRAPES) ×2 IMPLANT
DRAPE U-SHAPE 47X51 STRL (DRAPES) ×4 IMPLANT
DRSG AQUACEL AG ADV 3.5X10 (GAUZE/BANDAGES/DRESSINGS) ×2 IMPLANT
DURAPREP 26ML APPLICATOR (WOUND CARE) ×2 IMPLANT
ELECT REM PT RETURN 15FT ADLT (MISCELLANEOUS) ×2 IMPLANT
GAUZE XEROFORM 1X8 LF (GAUZE/BANDAGES/DRESSINGS) ×2 IMPLANT
GLOVE ECLIPSE 8.0 STRL XLNG CF (GLOVE) ×2 IMPLANT
GLOVE SRG 8 PF TXTR STRL LF DI (GLOVE) ×2 IMPLANT
GLOVE SURG ENC MOIS LTX SZ7.5 (GLOVE) ×2 IMPLANT
GLOVE SURG UNDER POLY LF SZ8 (GLOVE) ×4
GOWN STRL REUS W/TWL XL LVL3 (GOWN DISPOSABLE) ×4 IMPLANT
HANDPIECE INTERPULSE COAX TIP (DISPOSABLE) ×2
HEAD ARTICULEZE (Hips) IMPLANT
HOLDER FOLEY CATH W/STRAP (MISCELLANEOUS) ×2 IMPLANT
KIT TURNOVER KIT A (KITS) ×2 IMPLANT
LINER ACETAB NEUTRAL 36ID 520D (Liner) ×1 IMPLANT
PACK ANTERIOR HIP CUSTOM (KITS) ×2 IMPLANT
PENCIL SMOKE EVACUATOR (MISCELLANEOUS) IMPLANT
PIN SECTOR W/GRIP ACE CUP 52MM (Hips) ×1 IMPLANT
SET HNDPC FAN SPRY TIP SCT (DISPOSABLE) ×1 IMPLANT
STAPLER VISISTAT 35W (STAPLE) IMPLANT
STEM CORAIL KA11 (Stem) ×1 IMPLANT
STRIP CLOSURE SKIN 1/2X4 (GAUZE/BANDAGES/DRESSINGS) IMPLANT
SUT ETHIBOND NAB CT1 #1 30IN (SUTURE) ×2 IMPLANT
SUT ETHILON 2 0 PS N (SUTURE) IMPLANT
SUT MNCRL AB 4-0 PS2 18 (SUTURE) IMPLANT
SUT VIC AB 0 CT1 36 (SUTURE) ×2 IMPLANT
SUT VIC AB 1 CT1 36 (SUTURE) ×2 IMPLANT
SUT VIC AB 2-0 CT1 27 (SUTURE) ×4
SUT VIC AB 2-0 CT1 TAPERPNT 27 (SUTURE) ×2 IMPLANT
TRAY FOLEY MTR SLVR 16FR STAT (SET/KITS/TRAYS/PACK) IMPLANT

## 2020-11-22 NOTE — Progress Notes (Signed)
Pt c/o 10/10 pain to right hip unrelieved by morphine 1 mg and Tramadol 50 mg. Notified on call provider for Dr. Ninfa Linden. Received a call back from Dr. Durward Fortes. New orders to d/c morphine and add Dilaudid 0.5mg -1mg  IV Q2 hrs PRN for severe pain unrelieved by PO analgesics. New orders implemented and will continue to monitor.

## 2020-11-22 NOTE — Telephone Encounter (Signed)
Discussed results of CT CAP - Rt Axillary LN - Bone lesions rt femur and Rt 3rd rib and Rt T 4 transverse process. No other visceral disease Shes in surgery now. F/U in 3 weeks

## 2020-11-22 NOTE — Brief Op Note (Signed)
11/22/2020  4:51 PM  PATIENT:  Jessica [name]  86 y.o. female  PRE-OPERATIVE DIAGNOSIS:  impending pathologic fracture, avascular necrosis with collapse right hip  POST-OPERATIVE DIAGNOSIS:  impending pathologic fracture, avascular necrosis with collapse right hip  PROCEDURE:  Procedure(s): RIGHT TOTAL HIP ARTHROPLASTY ANTERIOR APPROACH (Right)  SURGEON:  Surgeon(s) and Role:    Mcarthur Rossetti, MD - Primary  PHYSICIAN ASSISTANT:  Benita Stabile, PA-C  ANESTHESIA:   spinal  EBL:  150 mL   COUNTS:  YES  DICTATION: .Other Dictation: Dictation Number [id number]  PLAN OF CARE: Admit for overnight observation  PATIENT DISPOSITION:  PACU - hemodynamically stable.   Delay start of Pharmacological VTE agent (>24hrs) due to surgical blood loss or risk of bleeding: no

## 2020-11-22 NOTE — Op Note (Signed)
NAME: Jessica [name], Jessica [name] [id number] ACCOUNT [id number] DATE OF BIRTH:01/09/1935 FACILITY: [facility] LOCATION: [facility] PHYSICIAN:Jameca Chumley Kerry [place], MD  OPERATIVE REPORT  DATE OF PROCEDURE:  11/22/2020  PREOPERATIVE DIAGNOSIS:  Right hip avascular necrosis with questionable metastatic disease of the right proximal femur.  POSTOPERATIVE DIAGNOSIS:  Right hip avascular necrosis with questionable metastatic disease of the right proximal femur.  PROCEDURE:  Right total hip arthroplasty through direct anterior approach.  IMPLANTS:  DePuy Sector Gription acetabular component size 52, size 36+0 neutral polyethylene liner, size 11 Corail femoral component with standard offset, size 36+5 metal hip ball.  SURGEON:  Lind Guest. Ninfa Linden, MD  ASSISTANT:  Erskine Emery, PA-C.  ANESTHESIA:  Spinal.  ANTIBIOTICS:  Two grams IV Ancef.  ESTIMATED BLOOD LOSS:  100-150 mL.  COMPLICATIONS:  None.  INDICATIONS:  The patient is an 86 year old female who has developed rapidly worse hip pain for the last several months now.  We had seen her in the office on several occasions with pain in her hip.  This started sometime around July of last year.  We  felt that this potentially trochanteric bursitis, but then her pain started to get significantly worse.  X-rays did not show any significant pathology around the hip.  An MRI was obtained.  The MRI does show avascular necrosis with subchondral collapse  of the femoral head, but there was also edema a lot in the femoral neck and all proximal femur suggesting this could be pathologic in nature.  Her pain has rapidly gotten worse and she has been seen by oncology.  At this point, we felt that a total hip  arthroplasty is appropriate.  Our plans to send the femoral neck and femoral head to pathology to make sure that there is no pathologic features as well as the potential for AVN.  At this point, given her age of 37 and the detrimental effect  that her  decreased mobility is having on her quality of life and her activities of daily living and given the fact that her hip is severely painful we do feel that the surgery is warranted at this point.  We had a long and thorough discussion with her and talked  about the risk of acute blood loss anemia, nerve or vessel injury, fracture, infection, dislocation, DVT and implant failure.  We also talked about the fact that this is definitely pathologic in terms of some type of a metastasis that during the surgery  would likely spread more of this around her body.  Given the severity of her pain and immobility at this point, she does wish to proceed with surgery.  We did have a long and thorough appropriate discussion about this.  DESCRIPTION OF PROCEDURE:  After informed consent was obtained and appropriate right hip was marked.  She was brought to the operating room and sat up on a stretcher where spinal anesthesia was then obtained.  She was laid in supine position on a  stretcher.  A Foley catheter was placed and we assessed her leg lengths and found her right side to be shorter than the left.  We then placed traction boots on her feet and she was placed supine on the Hana fracture table, the perineal post in place and  both legs in line skeletal traction device and no traction applied.  Her right operative hip was prepped and draped with DuraPrep and sterile drapes.  A time-out was called to identify correct patient, correct right hip.  We then made an  incision just  inferior and posterior to the anterior superior iliac spine we carried this obliquely down the leg.  We dissected down tensor fascia lata muscle.  Tensor fascia was then divided longitudinally to proceed with direct anterior approach to the hip.  We  identified and cauterized circumflex vessels and identified the hip capsule, opened the hip capsule in an L-type format and finding a very large joint effusion.  We placed curved retractors  around the medial and lateral femoral neck and made our femoral  neck cut with an oscillating saw and completed this with an osteotome and placed a corkscrew guide in the femoral head and removed the femoral head in its entirety and found 1 area of full-thickness cartilage loss centrally and this was more consistent  with avascular necrosis.  We did send the femoral head off to pathology as well as some of the bone around the proximal femur and femoral neck.  We then placed a bent Hohmann over the medial acetabular rim and removed remnants of the acetabular labrum  and other debris and then began reaming under direct visualization from a size 44 reamer in stepwise increments up to a size 52 with all reamers under direct visualization, the last reamer under direct fluoroscopy, so I can obtain my depth of reaming by  inclination and anteversion.  I then placed the real DePuy Sector Gription acetabular component size 52 with a 36+0 neutral polyethylene liner for that size 52 acetabular component.  Attention was then turned to the femur.  With the leg externally  rotated to 120 degrees, extended and adducted, I was able to place a Mueller retractor medially and Hohmann retractor above the greater trochanter.  We released lateral joint capsule and used a box-cutting osteotome to enter femoral canal, it was strange  that we found to have the bone incredibly hard, almost as if there was a pathologic fracture at some point.  We passed that bone off as well for pathologic assessment.  We then began broaching using the Corail broaching system from a size 8 going up to  a size 11.  With a size 11 in place, we trialed a standard offset femoral neck and a 36+1.5 hip ball, reduced this in the acetabulum.  We felt like we definitely needed more leg length and offset.  We dislocated the hip and removed the trial components.   We placed the real Corail femoral component size 11 with standard offset and the real 36+5 metal  hip ball and again reduced this in the acetabulum.  We were pleased with the leg length, offset, range of motion and stability assessed mechanically and  radiographically.  We then irrigated the soft tissue with normal saline solution using pulsatile lavage.  We closed the joint capsule with interrupted #1 Ethibond suture, followed by #1 Vicryl to close the tensor fascia, 0 Vicryl was used to close deep  tissue and 2-0 Vicryl was used to close subcutaneous tissue.  The skin was closed with staples.  An Aquacel dressing was applied.  She was taken off the Hana table and taken to recovery room in stable condition with all final counts being correct.  No  complications noted.  Of note, Benita Stabile, PA-C, assisted during the entire case and assistance was crucial for facilitating all aspects of this case.  HN/NUANCE  D:11/22/2020 T:11/22/2020 JOB:[id number]

## 2020-11-22 NOTE — Transfer of Care (Signed)
Immediate Anesthesia Transfer of Care Note  Patient: KATILIN RAYNES  Procedure(s) Performed: RIGHT TOTAL HIP ARTHROPLASTY ANTERIOR APPROACH (Right Hip)  Patient Location: PACU  Anesthesia Type:General  Level of Consciousness: awake, drowsy and patient cooperative  Airway & Oxygen Therapy: Patient Spontanous Breathing and Patient connected to face mask oxygen  Post-op Assessment: Report given to RN, Post -op Vital signs reviewed and stable and Post -op Vital signs reviewed and unstable, Anesthesiologist notified  Post vital signs: Reviewed, stable and unstable--BP low in PACU; giving Albumin; patient responsive but still drowsy; MDA made aware  Last Vitals:  Vitals Value Taken Time  BP 79/55 11/22/20 1709  Temp 36.4 C 11/22/20 1705  Pulse 70 11/22/20 1713  Resp 16 11/22/20 1713  SpO2 100 % 11/22/20 1713  Vitals shown include unvalidated device data.  Last Pain:  Vitals:   11/22/20 1300  TempSrc:   PainSc: 1       Patients Stated Pain Goal: 3 (32/12/24 8250)  Complications: No complications documented.

## 2020-11-22 NOTE — H&P (Signed)
TOTAL HIP ADMISSION H&P  Patient is admitted for right total hip arthroplasty.  Subjective:  Chief Complaint: right hip pain  HPI: Jessica [name], 86 y.o. female, has a history of pain and functional disability in the right hip(s) due to avascualr necrosis and possible cancer and patient has failed non-surgical conservative treatments for greater than 12 weeks to include NSAID's and/or analgesics, corticosteriod injections, use of assistive devices and activity modification.  Onset of symptoms was abrupt starting just a few months ago years ago with rapidlly worsening course since that time.The patient noted no past surgery on the right hip(s).  Patient currently rates pain in the right hip at 10 out of 10 with activity. Patient has night pain, worsening of pain with activity and weight bearing, trendelenberg gait, pain that interfers with activities of daily living and pain with passive range of motion. Patient has evidence of subchondral cysts and subchondral fracture and questionable metastatic lesion by imaging studies. This condition presents safety issues increasing the risk of falls.  There is no current active infection.  Patient Active Problem List   Diagnosis Date Noted   Avascular necrosis of bone of hip, right ([place]) 11/22/2020   Breast cancer of lower-inner quadrant of right female breast ([place]) 02/02/2016   Past Medical History:  Diagnosis Date   Arthritis    Breast cancer ([place])    rt breast   History of kidney stones     Past Surgical History:  Procedure Laterality Date   ABDOMINAL HYSTERECTOMY     APPENDECTOMY     BREAST LUMPECTOMY WITH RADIOACTIVE SEED LOCALIZATION Right 03/20/2016   Procedure: RIGHT BREAST LUMPECTOMY WITH RADIOACTIVE SEED LOCALIZATION, AND RIGHT BREAST PARTIAL MASTECTOMY;  Surgeon: Coralie Keens, MD;  Location: [facility];  Service: General;  Laterality: Right;   BREAST SURGERY Left    breast lumpectomy   CATARACT EXTRACTION,  BILATERAL     KIDNEY STONE SURGERY     TOTAL ABDOMINAL HYSTERECTOMY W/ BILATERAL SALPINGOOPHORECTOMY     WRIST FRACTURE SURGERY Right     Current Facility-Administered Medications  Medication Dose Route Frequency Provider Last Rate Last Admin   ceFAZolin (ANCEF) IVPB 2g/100 mL premix  2 g Intravenous On Call to OR Pete Pelt, PA-C       lactated ringers infusion   Intravenous Continuous Brennan Bailey, MD 10 mL/hr at 11/22/20 1318 New Bag at 11/22/20 1318   Allergies  Allergen Reactions   Codeine Nausea Only    Social History   Tobacco Use   Smoking status: Former Smoker    Quit date: 02/02/2004    Years since quitting: 16.8   Smokeless tobacco: Never Used  Substance Use Topics   Alcohol use: No    Alcohol/week: 0.0 standard drinks    History reviewed. No pertinent family history.   Review of Systems  Musculoskeletal: Positive for gait problem.  All other systems reviewed and are negative.   Objective:  Physical Exam Vitals reviewed.  Constitutional:      Appearance: Normal appearance.  HENT:     Head: Normocephalic and atraumatic.     Mouth/Throat:     Mouth: Mucous membranes are dry.  Cardiovascular:     Rate and Rhythm: Normal rate.     Pulses: Normal pulses.  Pulmonary:     Effort: Pulmonary effort is normal.     Breath sounds: Normal breath sounds.  Abdominal:     Palpations: Abdomen is soft.  Musculoskeletal:     Cervical back:  Normal range of motion.     Right hip: Tenderness and bony tenderness present. Decreased range of motion. Decreased strength.  Neurological:     Mental Status: She is alert and oriented to person, place, and time.  Psychiatric:        Behavior: Behavior normal.     Vital signs in last 24 hours: Temp:  [99 F (37.2 C)] 99 F (37.2 C) (02/08 1249) Pulse Rate:  [88] 88 (02/08 1249) Resp:  [16] 16 (02/08 1249) BP: (162)/(87) 162/87 (02/08 1249) SpO2:  [92 %] 92 % (02/08 1249) Weight:  [66.7 kg] 66.7 kg  (02/08 1249)  Labs:   Estimated body mass index is 26.89 kg/m as calculated from the following:   Height as of this encounter: 5\' 2"  (1.575 m).   Weight as of this encounter: 66.7 kg.   Imaging Review The MRI of her right hip shoes AVN with a subchondral fracture as well as edema in the bone throughout the proximal femur.      Assessment/Plan:  End stage avascular necrosis, right hip(s)  The patient history, physical examination, clinical judgement of the provider and imaging studies are consistent with end stage AVN of the right hip(s) and total hip arthroplasty is deemed medically necessary. The treatment options including medical management, injection therapy, arthroscopy and arthroplasty were discussed at length. The risks and benefits of total hip arthroplasty were presented and reviewed. The risks due to aseptic loosening, infection, stiffness, dislocation/subluxation,  thromboembolic complications and other imponderables were discussed.  The patient acknowledged the explanation, agreed to proceed with the plan and consent was signed. Patient is being admitted for inpatient treatment for surgery, pain control, PT, OT, prophylactic antibiotics, VTE prophylaxis, progressive ambulation and ADL's and discharge planning.The patient is planning to be discharged home with home health services

## 2020-11-22 NOTE — Anesthesia Postprocedure Evaluation (Signed)
Anesthesia Post Note  Patient: Jessica [name]  Procedure(s) Performed: RIGHT TOTAL HIP ARTHROPLASTY ANTERIOR APPROACH (Right Hip)     Patient location during evaluation: PACU Anesthesia Type: MAC Level of consciousness: awake Pain management: pain level controlled Vital Signs Assessment: post-procedure vital signs reviewed and stable Respiratory status: spontaneous breathing, respiratory function stable and patient connected to nasal cannula oxygen Cardiovascular status: blood pressure returned to baseline and stable Postop Assessment: no headache, no backache and no apparent nausea or vomiting Anesthetic complications: no   No complications documented.  Last Vitals:  Vitals:   11/22/20 1900 11/22/20 2007  BP: (!) 152/73 139/69  Pulse: 79 80  Resp: 18 16  Temp: 36.5 C 36.9 C  SpO2: 100% 96%    Last Pain:  Vitals:   11/22/20 2007  TempSrc: Oral  PainSc:                  Karyl Kinnier Alexiana Laverdure

## 2020-11-22 NOTE — Anesthesia Procedure Notes (Signed)
Spinal  Patient location during procedure: OR Start time: 11/22/2020 3:33 PM End time: 11/22/2020 3:39 PM Staffing Performed: resident/CRNA  Anesthesiologist: Murvin Natal, MD Resident/CRNA: Raenette Rover, CRNA Preanesthetic Checklist Completed: patient identified, IV checked, site marked, risks and benefits discussed, surgical consent, monitors and equipment checked, pre-op evaluation and timeout performed Spinal Block Patient position: sitting Prep: DuraPrep Patient monitoring: blood pressure, continuous pulse ox and heart rate Approach: midline Location: L3-4 Injection technique: single-shot Needle Needle type: Pencan  Needle gauge: 24 G Assessment Sensory level: T6

## 2020-11-22 NOTE — Anesthesia Preprocedure Evaluation (Addendum)
Anesthesia Evaluation  Patient identified by MRN, date of birth, ID band Patient awake    Reviewed: Allergy & Precautions, H&P , NPO status , Patient's Chart, lab work & pertinent test results, reviewed documented beta blocker date and time   Airway Mallampati: I  TM Distance: >3 FB Neck ROM: full    Dental no notable dental hx. (+) Edentulous Upper, Edentulous Lower   Pulmonary neg pulmonary ROS, former smoker,    Pulmonary exam normal breath sounds clear to auscultation       Cardiovascular Exercise Tolerance: Good negative cardio ROS   Rhythm:regular Rate:Normal     Neuro/Psych negative neurological ROS  negative psych ROS   GI/Hepatic negative GI ROS, Neg liver ROS,   Endo/Other  negative endocrine ROS  Renal/GU negative Renal ROS  negative genitourinary   Musculoskeletal  (+) Arthritis , Osteoarthritis,    Abdominal   Peds  Hematology negative hematology ROS (+)   Anesthesia Other Findings   Reproductive/Obstetrics negative OB ROS                           Anesthesia Physical Anesthesia Plan  ASA: III  Anesthesia Plan: MAC and Spinal   Post-op Pain Management:    Induction: Intravenous  PONV Risk Score and Plan: 2 and Propofol infusion  Airway Management Planned: Natural Airway and Nasal Cannula  Additional Equipment: None  Intra-op Plan:   Post-operative Plan:   Informed Consent: I have reviewed the patients History and Physical, chart, labs and discussed the procedure including the risks, benefits and alternatives for the proposed anesthesia with the patient or authorized representative who has indicated his/her understanding and acceptance.     Dental Advisory Given  Plan Discussed with: CRNA and Anesthesiologist  Anesthesia Plan Comments:         Anesthesia Quick Evaluation

## 2020-11-23 ENCOUNTER — Encounter (HOSPITAL_COMMUNITY): Payer: Self-pay | Admitting: Orthopaedic Surgery

## 2020-11-23 DIAGNOSIS — C7951 Secondary malignant neoplasm of bone: Secondary | ICD-10-CM | POA: Diagnosis not present

## 2020-11-23 DIAGNOSIS — Z79899 Other long term (current) drug therapy: Secondary | ICD-10-CM | POA: Diagnosis not present

## 2020-11-23 DIAGNOSIS — Z853 Personal history of malignant neoplasm of breast: Secondary | ICD-10-CM | POA: Diagnosis not present

## 2020-11-23 DIAGNOSIS — Z87891 Personal history of nicotine dependence: Secondary | ICD-10-CM | POA: Diagnosis not present

## 2020-11-23 DIAGNOSIS — M87051 Idiopathic aseptic necrosis of right femur: Secondary | ICD-10-CM | POA: Diagnosis not present

## 2020-11-23 DIAGNOSIS — Z791 Long term (current) use of non-steroidal anti-inflammatories (NSAID): Secondary | ICD-10-CM | POA: Diagnosis not present

## 2020-11-23 LAB — CBC
HCT: 34.3 % — ABNORMAL LOW (ref 36.0–46.0)
Hemoglobin: 10.9 g/dL — ABNORMAL LOW (ref 12.0–15.0)
MCH: 29 pg (ref 26.0–34.0)
MCHC: 31.8 g/dL (ref 30.0–36.0)
MCV: 91.2 fL (ref 80.0–100.0)
Platelets: 177 10*3/uL (ref 150–400)
RBC: 3.76 MIL/uL — ABNORMAL LOW (ref 3.87–5.11)
RDW: 13.1 % (ref 11.5–15.5)
WBC: 7.8 10*3/uL (ref 4.0–10.5)
nRBC: 0 % (ref 0.0–0.2)

## 2020-11-23 LAB — BASIC METABOLIC PANEL
Anion gap: 7 (ref 5–15)
BUN: 14 mg/dL (ref 8–23)
CO2: 29 mmol/L (ref 22–32)
Calcium: 8.1 mg/dL — ABNORMAL LOW (ref 8.9–10.3)
Chloride: 103 mmol/L (ref 98–111)
Creatinine, Ser: 0.73 mg/dL (ref 0.44–1.00)
GFR, Estimated: >60 mL/min (ref >60)
Glucose, Bld: 106 mg/dL — ABNORMAL HIGH (ref 70–99)
Potassium: 4 mmol/L (ref 3.5–5.1)
Sodium: 139 mmol/L (ref 135–145)

## 2020-11-23 MED ORDER — HYDROCODONE-ACETAMINOPHEN 5-325 MG PO TABS: 1.0000 | ORAL_TABLET | Freq: Four times a day (QID) | ORAL | 0 refills | Status: DC | PRN

## 2020-11-23 MED ORDER — ASPIRIN 81 MG PO CHEW: 81.0000 mg | CHEWABLE_TABLET | Freq: Two times a day (BID) | ORAL | 0 refills | Status: DC

## 2020-11-23 MED ORDER — METHOCARBAMOL 500 MG PO TABS: 500.0000 mg | ORAL_TABLET | Freq: Four times a day (QID) | ORAL | 1 refills | Status: DC | PRN

## 2020-11-23 NOTE — Progress Notes (Signed)
Physical Therapy Treatment Patient Details Name: Jessica [name] MRN: [id number] DOB: October 03, 1935 Today's Date: 11/23/2020    History of Present Illness Pt is an 86 year old female s/p R THA direct anterior approach with PMHx significant for breast cancer.  Oncology note: "Bone lesions rt femur and Rt 3rd rib and Rt T 4 transverse process."    PT Comments    Pt assisted with ambulating in hallway and returned to recliner.  Pt politely declined any exercises due to pain and fatigue however agreeable to continue performing ankle pumps throughout the day.  Pt will need to practice safe stair technique prior to d/c home tomorrow.     Follow Up Recommendations  Home health PT;Follow surgeon's recommendation for DC plan and follow-up therapies     Equipment Recommendations  Rolling walker with 5" wheels    Recommendations for Other Services       Precautions / Restrictions Precautions Precautions: Fall Restrictions Weight Bearing Restrictions: No Other Position/Activity Restrictions: WBAT    Mobility  Bed Mobility Overal bed mobility: Needs Assistance Bed Mobility: Supine to Sit     Supine to sit: Min assist     General bed mobility comments: pt in recliner  Transfers Overall transfer level: Needs assistance Equipment used: Rolling walker (2 wheeled) Transfers: Sit to/from Stand Sit to Stand: Min guard         General transfer comment: verbal cues for UE and LE positioning. min/guard for safety  Ambulation/Gait Ambulation/Gait assistance: Min guard Gait Distance (Feet): 200 Feet Assistive device: Rolling walker (2 wheeled) Gait Pattern/deviations: Step-to pattern;Decreased stance time - right;Antalgic     General Gait Details: verbal cues for sequence, RW positioning, pt keeping R LE behind despite cues for sequence and demonstration as she report less pain with her technique   Stairs             Wheelchair Mobility    Modified Rankin (Stroke Patients  Only)       Balance                                            Cognition Arousal/Alertness: Awake/alert Behavior During Therapy: WFL for tasks assessed/performed Overall Cognitive Status: Within Functional Limits for tasks assessed                                        Exercises      General Comments        Pertinent Vitals/Pain Pain Assessment: 0-10 Pain Score: 5  Pain Location: incision Pain Descriptors / Indicators: Burning Pain Intervention(s): Monitored during session;Repositioned;Ice applied    Home Living Family/patient expects to be discharged to:: Private residence Living Arrangements: Alone Available Help at Discharge: Family;Available 24 hours/day Type of Home: House Home Access: Stairs to enter Entrance Stairs-Rails: None Home Layout: Able to live on main level with bedroom/bathroom Home Equipment: None      Prior Function Level of Independence: Independent          PT Goals (current goals can now be found in the care plan section) Acute Rehab PT Goals PT Goal Formulation: With patient Time For Goal Achievement: 11/30/20 Potential to Achieve Goals: Good Progress towards PT goals: Progressing toward goals    Frequency    7X/week  PT Plan Current plan remains appropriate    Co-evaluation              AM-PAC PT "6 Clicks" Mobility   Outcome Measure  Help needed turning from your back to your side while in a flat bed without using bedrails?: A Little Help needed moving from lying on your back to sitting on the side of a flat bed without using bedrails?: A Little Help needed moving to and from a bed to a chair (including a wheelchair)?: A Little Help needed standing up from a chair using your arms (e.g., wheelchair or bedside chair)?: A Little Help needed to walk in hospital room?: A Little Help needed climbing 3-5 steps with a railing? : A Lot 6 Click Score: 17    End of Session Equipment  Utilized During Treatment: Gait belt Activity Tolerance: Patient tolerated treatment well Patient left: in chair;with call bell/phone within reach;with chair alarm set;with family/visitor present Nurse Communication: Mobility status PT Visit Diagnosis: Other abnormalities of gait and mobility (R26.89)     Time: 1355-1414 PT Time Calculation (min) (ACUTE ONLY): 19 min  Charges:  $Gait Training: 8-22 mins                     Arlyce Dice, DPT Acute Rehabilitation Services Pager: [phone] Office: [phone]  York Ram E 11/23/2020, 2:50 PM

## 2020-11-23 NOTE — Evaluation (Signed)
Physical Therapy Evaluation Patient Details Name: Jessica [name] MRN: [id number] DOB: Jul 14, 1935 Today's Date: 11/23/2020   History of Present Illness  Pt is an 86 year old female s/p R THA direct anterior approach with PMHx significant for breast cancer.  Oncology note: "Bone lesions rt femur and Rt 3rd rib and Rt T 4 transverse process."  Clinical Impression  Patient is s/p above surgery resulting in functional limitations due to the deficits listed below (see PT Problem List).  Patient will benefit from skilled PT to increase their independence and safety with mobility to allow discharge to the venue listed below.  Pt assisted with ambulating short distance in hallway and reports burning incisional pain limiting mobility.  Pt plans to d/c home with family assist.      Follow Up Recommendations Home health PT;Follow surgeon's recommendation for DC plan and follow-up therapies    Equipment Recommendations  Rolling walker with 5" wheels    Recommendations for Other Services       Precautions / Restrictions Precautions Precautions: Fall Restrictions Weight Bearing Restrictions: No      Mobility  Bed Mobility Overal bed mobility: Needs Assistance Bed Mobility: Supine to Sit     Supine to sit: Min assist     General bed mobility comments: cues for technique, assist for right LE    Transfers Overall transfer level: Needs assistance Equipment used: Rolling walker (2 wheeled) Transfers: Sit to/from Stand Sit to Stand: Min guard         General transfer comment: verbal cues for UE and LE positioning. min/guard for safety  Ambulation/Gait Ambulation/Gait assistance: Min guard Gait Distance (Feet): 80 Feet Assistive device: Rolling walker (2 wheeled) Gait Pattern/deviations: Step-to pattern;Decreased stance time - right;Antalgic     General Gait Details: verbal cues for sequence, RW positioning, pt keeps forefoot contact likely due to incisional pain which she  reports with ambulating, a few short standing rest breaks required  Stairs            Wheelchair Mobility    Modified Rankin (Stroke Patients Only)       Balance                                             Pertinent Vitals/Pain Pain Assessment: 0-10 Pain Score: 7  Pain Location: incision Pain Descriptors / Indicators: Burning Pain Intervention(s): Limited activity within patient's tolerance;Premedicated before session;Repositioned;Ice applied    Home Living Family/patient expects to be discharged to:: Private residence Living Arrangements: Alone Available Help at Discharge: Family;Available 24 hours/day Type of Home: House Home Access: Stairs to enter Entrance Stairs-Rails: None Entrance Stairs-Number of Steps: 3 Home Layout: Able to live on main level with bedroom/bathroom Home Equipment: None      Prior Function Level of Independence: Independent               Hand Dominance        Extremity/Trunk Assessment        Lower Extremity Assessment Lower Extremity Assessment: RLE deficits/detail RLE Deficits / Details: anticipated post op hip weakness and pain, pt self assisting right LE       Communication   Communication: HOH  Cognition Arousal/Alertness: Awake/alert Behavior During Therapy: WFL for tasks assessed/performed Overall Cognitive Status: Within Functional Limits for tasks assessed  General Comments      Exercises     Assessment/Plan    PT Assessment Patient needs continued PT services  PT Problem List Decreased strength;Decreased mobility;Decreased balance;Decreased activity tolerance;Decreased knowledge of use of DME;Pain       PT Treatment Interventions Gait training;DME instruction;Therapeutic exercise;Balance training;Stair training;Functional mobility training;Therapeutic activities;Patient/family education    PT Goals (Current goals can be found  in the Care Plan section)  Acute Rehab PT Goals PT Goal Formulation: With patient Time For Goal Achievement: 11/30/20 Potential to Achieve Goals: Good    Frequency 7X/week   Barriers to discharge        Co-evaluation               AM-PAC PT "6 Clicks" Mobility  Outcome Measure Help needed turning from your back to your side while in a flat bed without using bedrails?: A Little Help needed moving from lying on your back to sitting on the side of a flat bed without using bedrails?: A Little Help needed moving to and from a bed to a chair (including a wheelchair)?: A Little Help needed standing up from a chair using your arms (e.g., wheelchair or bedside chair)?: A Little Help needed to walk in hospital room?: A Little Help needed climbing 3-5 steps with a railing? : A Lot 6 Click Score: 17    End of Session Equipment Utilized During Treatment: Gait belt Activity Tolerance: Patient tolerated treatment well Patient left: in chair;with call bell/phone within reach;with chair alarm set Nurse Communication: Mobility status PT Visit Diagnosis: Other abnormalities of gait and mobility (R26.89)    Time: 2505-3976 PT Time Calculation (min) (ACUTE ONLY): 18 min   Charges:   PT Evaluation $PT Eval Low Complexity: 1 Low        Kati PT, DPT Acute Rehabilitation Services Pager: [phone] Office: [phone]  York Ram E 11/23/2020, 1:18 PM

## 2020-11-23 NOTE — Progress Notes (Signed)
Subjective: 1 Day Post-Op Procedure(s) (LRB): RIGHT TOTAL HIP ARTHROPLASTY ANTERIOR APPROACH (Right) Patient reports pain as mild.  Denies chest pain, SOB or nausea or vomiting. Has not been up with PT as it was late in day when arrived to floor.   Objective: Vital signs in last 24 hours: Temp:  [97.6 F (36.4 C)-99 F (37.2 C)] 98.1 F (36.7 C) (02/09 0627) Pulse Rate:  [62-88] 75 (02/09 0627) Resp:  [12-22] 16 (02/09 0627) BP: (76-162)/(55-87) 113/60 (02/09 0627) SpO2:  [92 %-100 %] 100 % (02/09 0627) Weight:  [66.7 kg] 66.7 kg (02/08 1900)  Intake/Output from previous day: 02/08 0701 - 02/09 0700 In: 2450.5 [P.O.:330; I.V.:1670.5; IV Piggyback:450] Out: 1050 [Urine:900; Blood:150] Intake/Output this shift: No intake/output data recorded.  Recent Labs    11/23/20 0338  HGB 10.9*   Recent Labs    11/23/20 0338  WBC 7.8  RBC 3.76*  HCT 34.3*  PLT 177   Recent Labs    11/23/20 0338  NA 139  K 4.0  CL 103  CO2 29  BUN 14  CREATININE 0.73  GLUCOSE 106*  CALCIUM 8.1*   No results for input(s): LABPT, INR in the last 72 hours.  Right lower Extremity: Dorsiflexion/Plantar flexion intact Incision: scant drainage Compartment soft   Assessment/Plan: 1 Day Post-Op Procedure(s) (LRB): RIGHT TOTAL HIP ARTHROPLASTY ANTERIOR APPROACH (Right) Up with therapy  Awaiting pathology results of right femoral head and neck.      Jessica [name] 11/23/2020, 8:32 AM

## 2020-11-23 NOTE — Discharge Instructions (Signed)

## 2020-11-24 DIAGNOSIS — C7951 Secondary malignant neoplasm of bone: Secondary | ICD-10-CM | POA: Diagnosis not present

## 2020-11-24 DIAGNOSIS — Z79899 Other long term (current) drug therapy: Secondary | ICD-10-CM | POA: Diagnosis not present

## 2020-11-24 DIAGNOSIS — Z853 Personal history of malignant neoplasm of breast: Secondary | ICD-10-CM | POA: Diagnosis not present

## 2020-11-24 DIAGNOSIS — Z87891 Personal history of nicotine dependence: Secondary | ICD-10-CM | POA: Diagnosis not present

## 2020-11-24 DIAGNOSIS — M87051 Idiopathic aseptic necrosis of right femur: Secondary | ICD-10-CM | POA: Diagnosis not present

## 2020-11-24 DIAGNOSIS — Z791 Long term (current) use of non-steroidal anti-inflammatories (NSAID): Secondary | ICD-10-CM | POA: Diagnosis not present

## 2020-11-24 NOTE — Progress Notes (Signed)
RN reviewed discharge instructions with patient and family. All questions answered.   Paperwork given. Prescriptions electronically sent to patient pharmacy.    NT rolled patient down with all belongings to family car.     Sherwood Castilla, RN  

## 2020-11-24 NOTE — Progress Notes (Signed)
Subjective: 2 Days Post-Op Procedure(s) (LRB): RIGHT TOTAL HIP ARTHROPLASTY ANTERIOR APPROACH (Right) Patient reports pain as mild.  Patient dressed in regular clothing,  sitting in chair . States she is ready to go home. No complaints states hip feeling better.   Objective: Vital signs in last 24 hours: Temp:  [98.5 F (36.9 C)-99.7 F (37.6 C)] 99.7 F (37.6 C) (02/10 0602) Pulse Rate:  [83-89] 83 (02/10 0602) Resp:  [16-18] 17 (02/10 0602) BP: (103-118)/(52-65) 103/55 (02/10 0602) SpO2:  [87 %-100 %] 97 % (02/10 0602)  Intake/Output from previous day: 02/09 0701 - 02/10 0700 In: 1460 [P.O.:1260; I.V.:200] Out: 750 [Urine:750] Intake/Output this shift: Total I/O In: 250 [P.O.:250] Out: 400 [Urine:400]  Recent Labs    11/23/20 0338  HGB 10.9*   Recent Labs    11/23/20 0338  WBC 7.8  RBC 3.76*  HCT 34.3*  PLT 177   Recent Labs    11/23/20 0338  NA 139  K 4.0  CL 103  CO2 29  BUN 14  CREATININE 0.73  GLUCOSE 106*  CALCIUM 8.1*   No results for input(s): LABPT, INR in the last 72 hours.  Right lower extremity: Dorsiflexion/Plantar flexion intact Incision: scant drainage Compartment soft   Assessment/Plan: 2 Days Post-Op Procedure(s) (LRB): RIGHT TOTAL HIP ARTHROPLASTY ANTERIOR APPROACH (Right) Discharge home with home health Dressing changed.     Jhamir Pickup 11/24/2020, 10:02 AM

## 2020-11-24 NOTE — Discharge Summary (Signed)
Patient ID: DELENE MORAIS MRN: [id number] DOB/AGE: [birth year] 86 y.o.  Admit date: 11/22/2020 Discharge date: 11/24/2020  Admission Diagnoses:  Principal Problem:   Avascular necrosis of bone of hip, right ([place]) Active Problems:   Status post total replacement of right hip   Discharge Diagnoses:  Status post Right total hip arthroplasty  Past Medical History:  Diagnosis Date  . Arthritis   . Breast cancer ([place])    rt breast  . History of kidney stones     Surgeries: Procedure(s): RIGHT TOTAL HIP ARTHROPLASTY ANTERIOR APPROACH on 11/22/2020   Consultants:   Discharged Condition: Improved  Hospital Course: CAROL THEYS is an 86 y.o. female who was admitted 11/22/2020 for operative treatment ofAvascular necrosis of bone of hip, right ([place]). Patient has severe unremitting pain that affects sleep, daily activities, and work/hobbies. After pre-op clearance the patient was taken to the operating room on 11/22/2020 and underwent  Procedure(s): RIGHT TOTAL HIP ARTHROPLASTY ANTERIOR APPROACH.    Right hip sent to pathology intra-op r/o pathologic fracture vs avascular necrosis.   Patient was given perioperative antibiotics:  Anti-infectives (From admission, onward)   Start     Dose/Rate Route Frequency Ordered Stop   11/22/20 2200  ceFAZolin (ANCEF) IVPB 1 g/50 mL premix        1 g 100 mL/hr over 30 Minutes Intravenous Every 6 hours 11/22/20 1920 11/23/20 0455   11/22/20 1300  ceFAZolin (ANCEF) IVPB 2g/100 mL premix        2 g 200 mL/hr over 30 Minutes Intravenous On call to O.R. 11/22/20 1249 11/22/20 1613       Patient was given sequential compression devices, early ambulation, and chemoprophylaxis to prevent DVT.  Patient benefited maximally from hospital stay and there were no complications.    Recent vital signs:  Patient Vitals for the past 24 hrs:  BP Temp Temp src Pulse Resp SpO2  11/24/20 0602 (!) 103/55 99.7 F (37.6 C) Oral 83 17 97 %  11/24/20 0128 -- -- -- --  -- 95 %  11/24/20 0125 -- -- -- -- -- (!) 89 %  11/23/20 2011 111/61 99.6 F (37.6 C) Oral 89 17 (!) 87 %  11/23/20 1800 118/65 98.8 F (37.1 C) Oral 85 16 97 %  11/23/20 1400 (!) 105/52 98.5 F (36.9 C) Oral 86 18 100 %     Recent laboratory studies:  Recent Labs    11/23/20 0338  WBC 7.8  HGB 10.9*  HCT 34.3*  PLT 177  NA 139  K 4.0  CL 103  CO2 29  BUN 14  CREATININE 0.73  GLUCOSE 106*  CALCIUM 8.1*     Discharge Medications:   Allergies as of 11/24/2020      Reactions   Codeine Nausea Only      Medication List    TAKE these medications   aspirin 81 MG chewable tablet Chew 1 tablet (81 mg total) by mouth 2 (two) times daily.   HYDROcodone-acetaminophen 5-325 MG tablet Commonly known as: NORCO/VICODIN Take 1-2 tablets by mouth every 6 (six) hours as needed for moderate pain. What changed: how much to take   ibuprofen 200 MG tablet Commonly known as: ADVIL Take 400 mg by mouth every 6 (six) hours as needed for moderate pain.   methocarbamol 500 MG tablet Commonly known as: ROBAXIN Take 1 tablet (500 mg total) by mouth every 6 (six) hours as needed for muscle spasms.  Durable Medical Equipment  (From admission, onward)         Start     Ordered   11/22/20 1921  DME 3 n 1  Once        11/22/20 1920   11/22/20 1921  DME Walker rolling  Once       Question Answer Comment  Walker: With 5 Inch Wheels   Patient needs a walker to treat with the following condition Status post total replacement of right hip      11/22/20 1920          Diagnostic Studies: NM Bone Scan Whole Body  Result Date: 11/04/2020 CLINICAL DATA:  Cancer of unknown primary EXAM: NUCLEAR MEDICINE WHOLE BODY BONE SCAN TECHNIQUE: Whole body anterior and posterior images were obtained approximately 3 hours after intravenous injection of radiopharmaceutical. RADIOPHARMACEUTICALS:  20.3 mCi Technetium-56m MDP IV COMPARISON:  02/10/2016 Correlation: MR RIGHT hip 10/22/2020  FINDINGS: Increased uptake at and upper lateral RIGHT rib approximately third consistent with osseous metastasis, extending over a greater length and seen on the previous exam. New focus of uptake identified at RIGHT costovertebral junction of approximately the fourth rib highly suspicious for osseous metastasis. Uptake at the [facility] joints and sternoclavicular joints likely degenerative. Increased tracer uptake at the RIGHT femoral neck and femoral head, corresponding to stress fracture and early AVN changes of the RIGHT femoral head on recent MR. No additional abnormal sites of tracer uptake identified. Expected urinary tract and soft tissue distribution of tracer. IMPRESSION: Suspected metastatic lesions involving the lateral RIGHT third rib and the posterior RIGHT fourth rib at the costovertebral junction. Increased tracer uptake at the RIGHT femoral head, neck and intertrochanteric region, corresponding to stress fracture and early AVN changes seen on recent MRI. Electronically Signed   By: Lavonia Dana M.D.   On: 11/04/2020 17:23   DG Pelvis Portable  Result Date: 11/22/2020 CLINICAL DATA:  86 year old female status post total right hip arthroplasty. EXAM: PORTABLE PELVIS 1-2 VIEWS COMPARISON:  Right hip radiograph dated 10/19/2020. FINDINGS: There is a total right hip arthroplasty. The arthroplasty components appear intact and in anatomic alignment. There is no acute fracture or dislocation. The bones are osteopenic. Postsurgical changes in the soft tissues of the right hip and cutaneous clips. IMPRESSION: Satisfactory appearance of the total right hip arthroplasty. Electronically Signed   By: Anner Crete M.D.   On: 11/22/2020 19:24   CT CHEST ABDOMEN PELVIS W CONTRAST  Result Date: 11/18/2020 CLINICAL DATA:  86 year old female with history of right-sided breast cancer with metastatic disease, status post mastectomy. EXAM: CT CHEST, ABDOMEN, AND PELVIS WITH CONTRAST TECHNIQUE: Multidetector CT imaging  of the chest, abdomen and pelvis was performed following the standard protocol during bolus administration of intravenous contrast. CONTRAST:  158mL OMNIPAQUE IOHEXOL 300 MG/ML  SOLN COMPARISON:  Chest CT 02/10/2016. CT the abdomen and pelvis 05/04/2010. FINDINGS: CT CHEST FINDINGS Cardiovascular: Heart size is normal. There is no significant pericardial fluid, thickening or pericardial calcification. There is aortic atherosclerosis, as well as atherosclerosis of the great vessels of the mediastinum and the coronary arteries, including calcified atherosclerotic plaque in the left anterior descending coronary artery. Mediastinum/Nodes: No pathologically enlarged mediastinal or hilar lymph nodes. Please note that accurate exclusion of hilar adenopathy is limited on noncontrast CT scans. Esophagus is unremarkable in appearance. Enlarged ill-defined right axillary lymph node measuring 1.2 cm in short axis (axial image 23 of series 2). Additional 7 mm short axis lymph node high in the right axilla is  nonspecific. Lungs/Pleura: In the lateral aspect of the left lower lobe (axial image 98 of series 4) there is an 11 x 7 mm cavitary area with some mural thickening which is nonspecific, but likely slowly evolved compared to remote prior study from 02/10/2016, favored to be sequela of prior infection. No other suspicious appearing pulmonary nodules or masses are noted. No acute consolidative airspace disease. No pleural effusions. Bilateral apical pleuroparenchymal thickening and architectural distortion, similar to prior studies, most compatible with chronic post infectious or inflammatory scarring. Diffuse bronchial wall thickening with moderate to severe centrilobular and paraseptal emphysema. Musculoskeletal: Postoperative changes in the right breast, presumably from prior lumpectomy. Mixed lytic and sclerotic areas in the lateral aspect of the right third rib. Small lytic lesion in the right T4 transverse process (axial  image 13 of series 2). CT ABDOMEN PELVIS FINDINGS Hepatobiliary: No suspicious cystic or solid hepatic lesions. No intra or extrahepatic biliary ductal dilatation. Gallbladder is normal in appearance. Pancreas: No pancreatic mass. No pancreatic ductal dilatation. No pancreatic or peripancreatic fluid collections or inflammatory changes. Spleen: Unremarkable. Adrenals/Urinary Tract: Multiple subcentimeter low-attenuation lesions in both kidneys, too small to characterize, but statistically likely to represent tiny cysts. Bilateral adrenal glands are normal in appearance. No hydroureteronephrosis. Urinary bladder is normal in appearance. Stomach/Bowel: Normal appearance of the stomach. No pathologic dilatation of small bowel or colon. Large duodenal diverticulum extending off the mid medial aspect of the second portion of the duodenum measuring approximately 4.3 x 2.2 x 5.5 cm, without surrounding inflammatory changes to suggest an associated duodenal diverticulitis at this time. A few scattered colonic diverticulae are also noted, most evident in the region of the sigmoid colon, without surrounding inflammatory changes. The appendix is not confidently identified and may be surgically absent. Regardless, there are no inflammatory changes noted adjacent to the cecum to suggest the presence of an acute appendicitis at this time. Vascular/Lymphatic: Aortic atherosclerosis, without evidence of aneurysm or dissection in the abdominal or pelvic vasculature. No lymphadenopathy noted in the abdomen or pelvis. Reproductive: Status post hysterectomy. Ovaries may be surgically absent are not confidently identified or atrophic. Other: No significant volume of ascites.  No pneumoperitoneum. Musculoskeletal: Infiltrative mixed lytic and sclerotic lesion in the right femoral head and neck with what appears to be a nondisplaced pathologic fracture. Sclerotic lesion measuring 1.3 cm in the left femoral neck (axial image 109 of series  2). IMPRESSION: 1. Mildly enlarged right axillary lymph node with ill-defined margins measuring 1.2 cm in diameter, concerning for local nodal metastasis. In addition, there is a mixed lytic and sclerotic lesion in the right femoral head and neck with a nondisplaced pathologic fracture which likely represents a metastatic lesion, as well as lesions in the lateral aspect of the right third rib and right T4 transverse process, which both likely represent metastatic lesions. An additional sclerotic lesion in the left femoral neck is nonspecific, but may simply represent a bone island given the lack of activity on recent whole-body bone scan. 2. No other definite extra nodal or extraskeletal metastatic disease identified elsewhere in the chest, abdomen or pelvis. 3. Small cavitary lesion with some mural thickening in the left lower lobe which appears slowly progressive compared to remote prior study from 2017. This may simply represent an area of chronic post infectious or inflammatory scarring, however, continued attention to this region on follow-up studies is recommended to ensure stability. 4. Diffuse bronchial wall thickening with moderate to severe centrilobular and paraseptal emphysema; imaging findings suggestive of underlying  COPD. 5. Aortic atherosclerosis, in addition to left anterior descending coronary artery disease. 6. Colonic diverticulosis without evidence of acute diverticulitis at this time. 7. Large duodenal diverticulum, as above, without evidence to suggest an associated duodenal diverticulitis. Electronically Signed   By: Vinnie Langton M.D.   On: 11/18/2020 08:29   DG C-Arm 1-60 Min-No Report  Result Date: 11/22/2020 Fluoroscopy was utilized by the requesting physician.  No radiographic interpretation.   DG HIP OPERATIVE UNILAT W OR W/O PELVIS RIGHT  Result Date: 11/22/2020 CLINICAL DATA:  Right hip replacement. EXAM: OPERATIVE RIGHT HIP (WITH PELVIS IF PERFORMED) 3 VIEWS TECHNIQUE:  Fluoroscopic spot image(s) were submitted for interpretation post-operatively. COMPARISON:  October 19, 2020 FINDINGS: Fluoro time: 14 seconds. Reported radiation: 1.5359 mGy. Three C-arm fluoroscopic images were obtained intraoperatively and submitted for post operative interpretation. These images demonstrate total right hip arthroplasty with normal alignment. No unexpected findings. Please see the performing provider's procedural report for further detail. IMPRESSION: Intraoperative fluoroscopic imaging, as detailed above. Electronically Signed   By: Margaretha Sheffield MD   On: 11/22/2020 17:50    Disposition: Discharge disposition: 01-Home or [place]    Mcarthur Rossetti, MD Follow up in 2 week(s).   Specialty: Orthopedic Surgery Contact information: [address] [place] [address] [phone]                Signed: Erskine Emery 11/24/2020, 10:06 AM

## 2020-11-24 NOTE — Progress Notes (Signed)
Physical Therapy Treatment Patient Details Name: Jessica [name] MRN: [id number] DOB: 1935/10/07 Today's Date: 11/24/2020    History of Present Illness Pt is an 86 year old female s/p R THA direct anterior approach with PMHx significant for breast cancer.  Oncology note: "Bone lesions rt femur and Rt 3rd rib and Rt T 4 transverse process."    PT Comments    Pt ambulated in hallway and practiced safe stair technique.  Pt eager for d/c home today.  Pt plans to f/u with HHPT tomorrow.   Follow Up Recommendations  Home health PT;Follow surgeon's recommendation for DC plan and follow-up therapies     Equipment Recommendations  Rolling walker with 5" wheels    Recommendations for Other Services       Precautions / Restrictions Precautions Precautions: Fall Restrictions Other Position/Activity Restrictions: WBAT    Mobility  Bed Mobility               General bed mobility comments: pt in recliner    Transfers Overall transfer level: Needs assistance Equipment used: Rolling walker (2 wheeled) Transfers: Sit to/from Stand Sit to Stand: Min guard         General transfer comment: verbal cues for UE and LE positioning. min/guard for safety  Ambulation/Gait Ambulation/Gait assistance: Min guard Gait Distance (Feet): 200 Feet Assistive device: Rolling walker (2 wheeled) Gait Pattern/deviations: Step-to pattern;Decreased stance time - right;Antalgic     General Gait Details: verbal cues for sequence, RW positioning, pt keeping R LE behind despite cues for sequence; appears more steady with RW today however   Stairs Stairs: Yes Stairs assistance: Min guard Stair Management: Step to pattern;Forwards;Two rails Number of Stairs: 2 General stair comments: verbal cues for safety and sequence; pt reports understanding   Wheelchair Mobility    Modified Rankin (Stroke Patients Only)       Balance                                             Cognition Arousal/Alertness: Awake/alert Behavior During Therapy: WFL for tasks assessed/performed Overall Cognitive Status: Within Functional Limits for tasks assessed                                        Exercises      General Comments        Pertinent Vitals/Pain Pain Assessment: 0-10 Pain Score: 4  Pain Location: right hip Pain Descriptors / Indicators: Sore Pain Intervention(s): Repositioned;Monitored during session    Home Living                      Prior Function            PT Goals (current goals can now be found in the care plan section) Progress towards PT goals: Progressing toward goals    Frequency    7X/week      PT Plan Current plan remains appropriate    Co-evaluation              AM-PAC PT "6 Clicks" Mobility   Outcome Measure  Help needed turning from your back to your side while in a flat bed without using bedrails?: A Little Help needed moving from lying on your back to sitting on the side of a  flat bed without using bedrails?: A Little Help needed moving to and from a bed to a chair (including a wheelchair)?: A Little Help needed standing up from a chair using your arms (e.g., wheelchair or bedside chair)?: A Little Help needed to walk in hospital room?: A Little Help needed climbing 3-5 steps with a railing? : A Little 6 Click Score: 18    End of Session Equipment Utilized During Treatment: Gait belt Activity Tolerance: Patient tolerated treatment well Patient left: in chair;with call bell/phone within reach;with chair alarm set;with family/visitor present Nurse Communication: Mobility status PT Visit Diagnosis: Other abnormalities of gait and mobility (R26.89)     Time: 9528-4132 PT Time Calculation (min) (ACUTE ONLY): 8 min  Charges:  $Gait Training: 8-22 mins                     Arlyce Dice, DPT Acute Rehabilitation Services Pager: [phone] Office: [phone]  York Ram  E 11/24/2020, 3:55 PM

## 2020-11-25 DIAGNOSIS — Z853 Personal history of malignant neoplasm of breast: Secondary | ICD-10-CM | POA: Diagnosis not present

## 2020-11-25 DIAGNOSIS — Z87891 Personal history of nicotine dependence: Secondary | ICD-10-CM | POA: Diagnosis not present

## 2020-11-25 DIAGNOSIS — Z471 Aftercare following joint replacement surgery: Secondary | ICD-10-CM | POA: Diagnosis not present

## 2020-11-25 DIAGNOSIS — Z96641 Presence of right artificial hip joint: Secondary | ICD-10-CM | POA: Diagnosis not present

## 2020-12-01 LAB — SURGICAL PATHOLOGY

## 2020-12-06 ENCOUNTER — Encounter: Payer: Self-pay | Admitting: Orthopaedic Surgery

## 2020-12-06 ENCOUNTER — Ambulatory Visit (INDEPENDENT_AMBULATORY_CARE_PROVIDER_SITE_OTHER): Payer: Medicare Other | Admitting: Orthopaedic Surgery

## 2020-12-06 DIAGNOSIS — Z96641 Presence of right artificial hip joint: Secondary | ICD-10-CM

## 2020-12-06 NOTE — Progress Notes (Signed)
The patient is following up 2 weeks after a right total hip arthroplasty.  The pathology of the hip did show metastatic carcinoma.  She does have follow-up with oncology I believe next week.  She reports feeling much better after the hip replacement with improved strength and improved range of motion of the hip as well as decreased pain.  She is only taking Advil for pain.  She has been on aspirin twice a day.  She ambulates with a cane.  Examination of the right hip shows that it moves smoothly.  The staples been removed and Steri-Strips applied.  Her leg lengths are equal.  I did discuss with her the pathology of her right hip.  It is essential that she keep her appointment with oncology so they can discuss a further treatment plan in terms of what may be needed for her.  I will see her back myself in 4 weeks for repeat exam but no x-rays are needed.  All questions and concerns were answered and addressed.

## 2020-12-12 NOTE — Assessment & Plan Note (Signed)
Metastatic breast cancer MRI right hip 10/23/2020: Stress fracture and suspected early avascular necrosis versus metastatic cancer.  Patchy marrow edema in the right femoral head neck and intertrochanteric region.  Bone scan 11/04/2020: Suspected metastatic lesions involving lateral right third rib and posterior right fourth rib.  Increased tracer uptake right femoral head and neck and intertrochanteric region corresponding to stress fracture and early AVN  11/18/20: CT CAP: Mildly enl Rt Axill LN 1.2 cm, Mixed lytic and sclerotic lesion Rt femoral head, Rt 3rd rib, Rt T4 transverse process, COPD  Rt Fem Head resection: Met breast cancer ER Pos, PR Pos, Her 2 Neg  Plan: 1. Ibrance with Letrozole 2. Caris molecular test

## 2020-12-12 NOTE — Progress Notes (Signed)
Patient Care Team: Patient, No Pcp Per as PCP - General (General Practice) Magrinat, [name], MD as Consulting Physician (Oncology) [name], MD as Consulting Physician (Dermatology)  DIAGNOSIS:    ICD-10-CM   1. Malignant neoplasm of lower-inner quadrant of right breast of female, estrogen receptor positive ([place])  C50.311    Z17.0     SUMMARY OF ONCOLOGIC HISTORY: Oncology History  Breast cancer of lower-inner quadrant of right female breast ([place])  01/16/2016 Initial Biopsy   Skin biopsy done by Dr. Denna Haggard found metastatic carcinoma ER/PR +.   02/02/2016 Initial Diagnosis   Breast cancer of lower-inner quadrant of right female breast ([place])   02/02/2016 - 04/10/2016 Neo-Adjuvant Anti-estrogen oral therapy   Anastrozole 34m daily.   02/10/2016 Procedure   CT chest negative for metastatic disease Bone scan negative for metastatic disease   02/14/2016 Mammogram   1.7cm irregular mass in right lower inner quadrant corresponding with known breast cancer skin biopsy.   02/16/2016 Breast MRI   1. Biopsy proven cancer in lower inner quadrant of right breast: 2.8x1.7x1cm, extending to skin surface.  No other evidence of disease in right breast, left breast, or abnormal lymph nodes within the axillary or internal mammary chain regions.   2. Outside ultrasound report recommended additional ultrasound guided biopsy for hypoechoic mass in right breast at 9 o'clock suspected to be intramammary node.     02/22/2016 Procedure   Biopsy of 9 o'clock mass: complex sclerosing lesion with atypical ductal hyperplasia.     03/20/2016 Oncotype testing   Declined oncotype testing   03/20/2016 Surgery   1. Right lumpectomy (9 o'clock lesion): atypical ductal hyperplasia 2. Right partial mastectomy: IDC, grade 1, ER+(100%), PR(15%), Ki-67 5%, HER-2 neg (ratio 1.40), 2.5 cm, with atypical ductal hyperplasia, lymphovascular invasion, margins negative.   04/05/2016 -  Radiation Therapy   Declined radiation  therapy   07/17/2016 -  Anti-estrogen oral therapy   Started Tamoxifen 229mdaily.  A total of 5 years of therapy planned.   10/23/2020 Relapse/Recurrence   Patient has persistent right hip pain. MRI on 10/23/20 after continued pain showed non mass-like hypointensity involving the right femoral head, neck, and intertrochanteric region possibly representing metastasis with superimposed pathologic stress fracture. Bone scan on 11/04/20 showed suspected lesions involving the lateral right third rib and the posterior right fourth rib.    11/22/2020 Surgery   Right hip replacement (BNinfa Linden ER+ metastatic carcinoma consistent with breast origin.      CHIEF COMPLIANT: Follow-up of metastatic breast cancer s/p right hip replacement   INTERVAL HISTORY: BeMARCELL PFEIFERs a [age].o. with above-mentioned history of breast cancer with suspicion for metastatic disease. CT CAP on 11/17/20 showed an enlarged right axillary lymph node and none lesions at the right femur, right third rib, and right T4 transverse process. She underwent a right hip replacement with Dr. BlNinfa Lindenn 11/22/20 for which pathology showed ER+ metastatic carcinoma consistent with breast origin. She presents to the clinic today to review the pathology report and discuss further treatment.  She is doing remarkably well since her hip surgery.  She is able to walk with the help of a cane.   ALLERGIES:  is allergic to codeine.  MEDICATIONS:  Current Outpatient Medications  Medication Sig Dispense Refill  . letrozole (FEMARA) 2.5 MG tablet Take 1 tablet (2.5 mg total) by mouth daily. 90 tablet 3  . aspirin 81 MG chewable tablet Chew 1 tablet (81 mg total) by mouth 2 (two) times  daily. 30 tablet 0  . HYDROcodone-acetaminophen (NORCO/VICODIN) 5-325 MG tablet Take 1-2 tablets by mouth every 6 (six) hours as needed for moderate pain. 30 tablet 0  . ibuprofen (ADVIL,MOTRIN) 200 MG tablet Take 400 mg by mouth every 6 (six) hours as needed for moderate  pain.    . methocarbamol (ROBAXIN) 500 MG tablet Take 1 tablet (500 mg total) by mouth every 6 (six) hours as needed for muscle spasms. 40 tablet 1   No current facility-administered medications for this visit.    PHYSICAL EXAMINATION: ECOG PERFORMANCE STATUS: 1 - Symptomatic but completely ambulatory  Vitals:   12/13/20 1435  BP: (!) 156/82  Pulse: 86  Resp: 18  Temp: 97.7 F (36.5 C)  SpO2: 96%   Filed Weights   12/13/20 1435  Weight: 147 lb 4.8 oz (66.8 kg)    LABORATORY DATA:  I have reviewed the data as listed CMP Latest Ref Rng & Units 11/23/2020 11/18/2020 11/17/2020  Glucose 70 - 99 mg/dL 106(H) 98 -  BUN 8 - 23 mg/dL 14 16 -  Creatinine 0.44 - 1.00 mg/dL 0.73 1.00 0.70  Sodium 135 - 145 mmol/L 139 143 -  Potassium 3.5 - 5.1 mmol/L 4.0 4.7 -  Chloride 98 - 111 mmol/L 103 106 -  CO2 22 - 32 mmol/L 29 29 -  Calcium 8.9 - 10.3 mg/dL 8.1(L) 9.2 -  Total Protein 6.5 - 8.1 g/dL - 7.0 -  Total Bilirubin 0.3 - 1.2 mg/dL - 0.6 -  Alkaline Phos 38 - 126 U/L - 80 -  AST 15 - 41 U/L - 16 -  ALT 0 - 44 U/L - 16 -    Lab Results  Component Value Date   WBC 7.8 11/23/2020   HGB 10.9 (L) 11/23/2020   HCT 34.3 (L) 11/23/2020   MCV 91.2 11/23/2020   PLT 177 11/23/2020   NEUTROABS 3,754 10/30/2018    ASSESSMENT & PLAN:  Breast cancer of lower-inner quadrant of right female breast ([place]) Metastatic breast cancer MRI right hip 10/23/2020: Stress fracture and suspected early avascular necrosis versus metastatic cancer.  Patchy marrow edema in the right femoral head neck and intertrochanteric region.  Bone scan 11/04/2020: Suspected metastatic lesions involving lateral right third rib and posterior right fourth rib.  Increased tracer uptake right femoral head and neck and intertrochanteric region corresponding to stress fracture and early AVN  11/18/20: CT CAP: Mildly enl Rt Axill LN 1.2 cm, Mixed lytic and sclerotic lesion Rt femoral head, Rt 3rd rib, Rt T4 transverse process,  COPD  Rt Fem Head resection: Met breast cancer ER Pos, PR Pos, Her 2 Neg  Plan: 1. Ibrance with Letrozole: We will start her on letrozole at this time 2. palliative radiation to the right femoral head: I sent a referral to Dr. Isidore Moos. 3. Caris molecular test  We will add Ibrance once radiation is complete. I discussed with the patient the goal of treatment is palliation.  If any of the treatments are interfering with her quality of life then we may have to reassess that. Return to clinic in 3 weeks for follow-up.  No orders of the defined types were placed in this encounter.  The patient has a good understanding of the overall plan. she agrees with it. she will call with any problems that may develop before the next visit here.  Total time spent: 30 mins including face to face time and time spent for planning, charting and coordination of care  Rulon Eisenmenger,  MD, MPH 12/13/2020  I, Molly Dorshimer, am acting as scribe for Dr. Nicholas Lose.  I have reviewed the above documentation for accuracy and completeness, and I agree with the above.

## 2020-12-13 ENCOUNTER — Inpatient Hospital Stay: Payer: Medicare Other | Attending: Hematology and Oncology | Admitting: Hematology and Oncology

## 2020-12-13 ENCOUNTER — Other Ambulatory Visit: Payer: Self-pay

## 2020-12-13 DIAGNOSIS — J449 Chronic obstructive pulmonary disease, unspecified: Secondary | ICD-10-CM | POA: Diagnosis not present

## 2020-12-13 DIAGNOSIS — Z791 Long term (current) use of non-steroidal anti-inflammatories (NSAID): Secondary | ICD-10-CM | POA: Insufficient documentation

## 2020-12-13 DIAGNOSIS — Z170 Estrogen receptor positive status [ER+]: Secondary | ICD-10-CM | POA: Insufficient documentation

## 2020-12-13 DIAGNOSIS — Z923 Personal history of irradiation: Secondary | ICD-10-CM | POA: Diagnosis not present

## 2020-12-13 DIAGNOSIS — Z7982 Long term (current) use of aspirin: Secondary | ICD-10-CM | POA: Diagnosis not present

## 2020-12-13 DIAGNOSIS — C50311 Malignant neoplasm of lower-inner quadrant of right female breast: Secondary | ICD-10-CM | POA: Insufficient documentation

## 2020-12-13 DIAGNOSIS — C7951 Secondary malignant neoplasm of bone: Secondary | ICD-10-CM | POA: Diagnosis not present

## 2020-12-13 DIAGNOSIS — Z79811 Long term (current) use of aromatase inhibitors: Secondary | ICD-10-CM | POA: Diagnosis not present

## 2020-12-13 DIAGNOSIS — Z79899 Other long term (current) drug therapy: Secondary | ICD-10-CM | POA: Insufficient documentation

## 2020-12-13 MED ORDER — LETROZOLE 2.5 MG PO TABS: 2.5000 mg | ORAL_TABLET | Freq: Every day | ORAL | 3 refills | Status: DC

## 2020-12-13 NOTE — Addendum Note (Signed)
Addended by: Nicholas Lose on: 12/13/2020 05:04 PM   Modules accepted: Orders

## 2020-12-22 NOTE — Progress Notes (Signed)
Histology and Location of Primary Cancer:  Malignant neoplasm of lower-inner quadrant of RIGHT breast, estrogen receptor positive   Sites of Visceral and Bony Metastatic Disease:  CT CAP w/ Contrast 11/17/2020 IMPRESSION: 1. Mildly enlarged right axillary lymph node with ill-defined margins measuring 1.2 cm in diameter, concerning for local nodal metastasis. In addition, there is a mixed lytic and sclerotic lesion in the right femoral head and neck with a nondisplaced pathologic fracture which likely represents a metastatic lesion, as well as lesions in the lateral aspect of the right third rib and right T4 transverse process, which both likely represent metastatic lesions. An additional sclerotic lesion in the left femoral neck is nonspecific, but may simply represent a bone island given the lack of activity on recent whole-body bone scan. 2. No other definite extra nodal or extraskeletal metastatic disease identified elsewhere in the chest, abdomen or pelvis. 3. Small cavitary lesion with some mural thickening in the left lower lobe which appears slowly progressive compared to remote prior study from 2017. This may simply represent an area of chronic post infectious or inflammatory scarring, however, continued attention to this region on follow-up studies is recommended to ensure stability. 4. Diffuse bronchial wall thickening with moderate to severe centrilobular and paraseptal emphysema; imaging findings suggestive of underlying COPD. 5. Aortic atherosclerosis, in addition to left anterior descending coronary artery disease. 6. Colonic diverticulosis without evidence of acute diverticulitis at this time. 7. Large duodenal diverticulum, as above, without evidence to suggest an associated duodenal diverticulitis.  Location(s) of Symptomatic Metastases: Right hip  Past/Anticipated chemotherapy by medical oncology, if any:  Under care of Dr. Nicholas Lose 12/13/2020 Plan: 1. Ibrance with  Letrozole: We will start her on letrozole at this time 2. palliative radiation to the right femoral head: I sent a referral to Dr. Isidore Moos. 3. Caris molecular test --We will add Ibrance once radiation is complete. --I discussed with the patient the goal of treatment is palliation.   --If any of the treatments are interfering with her quality of life then we may have to reassess that. --Return to clinic in 3 weeks for follow-up.  Pain on a scale of 0-10 is: Patient denies any severe pain, but does state that her right hip is still sore/achy from her total hip replacement suregery   Ambulatory status? Walker? Wheelchair?: Ambulatory with cane, though she  SAFETY ISSUES:  Prior radiation? No--declined radiation for breast cancer  Pacemaker/ICD? No  Possible current pregnancy? No--hysterectomy   Is the patient on methotrexate? No  Current Complaints / other details:   11/22/2020 Dr. Jean Rosenthal Right total hip arthroplasty through direct anterior approach  Patient has received both Pfizer vaccines, as well as [employer] booster (unable to recall dates, and did not have vaccine card with her)

## 2020-12-23 ENCOUNTER — Ambulatory Visit
Admission: RE | Admit: 2020-12-23 | Discharge: 2020-12-23 | Disposition: A | Discharge status: Home / Self Care | Payer: Medicare Other | Source: Ambulatory Visit | Attending: Radiation Oncology | Admitting: Radiation Oncology

## 2020-12-23 ENCOUNTER — Other Ambulatory Visit: Payer: Self-pay

## 2020-12-23 VITALS — BP 157/80 | HR 90 | Temp 98.1°F | Resp 19 | Wt 145.1 lb

## 2020-12-23 DIAGNOSIS — C7951 Secondary malignant neoplasm of bone: Secondary | ICD-10-CM | POA: Insufficient documentation

## 2020-12-23 DIAGNOSIS — Z87891 Personal history of nicotine dependence: Secondary | ICD-10-CM | POA: Diagnosis not present

## 2020-12-23 DIAGNOSIS — Z170 Estrogen receptor positive status [ER+]: Secondary | ICD-10-CM | POA: Insufficient documentation

## 2020-12-23 DIAGNOSIS — Z9011 Acquired absence of right breast and nipple: Secondary | ICD-10-CM | POA: Insufficient documentation

## 2020-12-23 DIAGNOSIS — Z510 Encounter for antineoplastic radiation therapy: Secondary | ICD-10-CM | POA: Diagnosis not present

## 2020-12-23 DIAGNOSIS — Z79811 Long term (current) use of aromatase inhibitors: Secondary | ICD-10-CM | POA: Insufficient documentation

## 2020-12-23 DIAGNOSIS — C50311 Malignant neoplasm of lower-inner quadrant of right female breast: Secondary | ICD-10-CM | POA: Diagnosis not present

## 2020-12-23 NOTE — Progress Notes (Signed)
Radiation Oncology         ([phone]) [phone] ________________________________  Initial Outpatient Consultation  Name: Jessica [name] MRN: [id number]  Date: 12/23/2020  DOB: 11-30-1934  [id number], No Pcp Per  Nicholas Lose, MD   REFERRING PHYSICIAN: Nicholas Lose, MD  DIAGNOSIS:    ICD-10-CM   1. Bone metastases ([place])  C79.51     STAGE IV Breast Cancer  CHIEF COMPLAINT: Here to discuss management of metastatic right breast cancer  HISTORY OF PRESENT ILLNESS::Jessica [name] is a 86 y.o. female who presented to Dr. Denna Haggard in 2017 for evaluation of a skin lesion on the right breast. Biopsy of the lesion revealed a complex sclerosing lesion with atypical ductal hyperplasia.   Subsequently, the patient was seen by Dr. Jana Hakim and underwent neoadjuvant antiestrogen oral therapy with Anastrozole from 04/20/20217 - 04/10/2016.  She underwent a right lumpectomy/right partial mastectomy on the date of 03/20/2016 that revealed invasive ductal carcinoma spanning 2.5 cm with atypical ductal hyperplasia and lymphovascular invasion. The surgical resection margins were negative for carcinoma. ER status: 100% strong; PR status: 15% strong; Her2 status: negative. Grade: 1.  Of note, the patient declined oncotype testing and radiation therapy. Given her age, adjuvant chemotherapy was not recommended. Thus, she was started on antiestrogen therapy with Tamoxifen on 07/17/2016 because she could not tolerated Anastrozole.  Pertinent imaging studies at that time included: 1. CT scan of chest on 02/10/2016 that showed an inferomedial right breast nodule without definitive evidence of metastatic disease. 2. Bone scan on 02/10/2016 that showed no foci of radiotracer uptake suspicious for osseous metastatic disease. 3. Ultrasound of right breast on 02/14/2016 that showed a 1.7 cm irregular mass in the right lower inner quadrant corresponding with the known breast cancer skin biopsy. 4. MRI of bilateral breasts  on 02/16/2016 that showed the biopsy-proven cancer within the lower inner quadrant of the right breast at far posterior depth and measured 2.8 x 1.7 x 1.0 cm, extending to the skin surface and abutting the underlying pectoralis muscle without evidence of direct muscle invasion. No abnormal enhancement was seen within the muscle. There was no evidence of multifocal or multicentric disease within the right breast, no evidence of contralateral disease within the left breast, and no abnormal lymph nodes were identified within the bilateral axillary or internal mammary chain regions.  In more recent history, the patient underwent an MRI of the right hip on 10/22/2020 for evaluation of persistent right hip pain. 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 There was also noted to be mild bilateral hip osteoarthritis and degenerative tearing of the right anterior superior and superior labrum. Although the T2 appearance was non mass-like, the fairly confluent T1 marrow hypointensity involving the right femoral head, neck, and intertrochanteric region could have also represented an underlying marrow replacing lesion such as a metastasis, given the patient's history of breast cancer, with superimposed pathologic stress fracture.  Subsequently, the patient was seen by Dr. Ninfa Linden, who performed a right total hip arthroplasty on 11/22/2020. 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   Recent pertinent imaging studies include: 1. Bone scan on 2020/11/22 that showed suspected metastatic lesions involving the lateral right third rib and the posterior right fourth rib at the costovertebral junction. There was also noted to be increased tracer uptake at the right femoral head, neck, and intertrochanteric region, corresponding to the stress fracture and early AVN  changes seen on above  MRI. 2. CT scan of chest/abdomen/pelvis on 11/17/2020 that showed mildly enlarged right axillary lymph node with ill-defined margins that measured 1.3 cm in diameter, concerning for local nodal metastasis. In addition, there was a mixed lytic and sclerotic lesion in the right femoral head and neck with a non-displaced pathologic fracture which likely represented a metastatic lesion, as well as lesions in the lateral aspect of the right third rib and right T4 transverse process, which both likely represented metastatic lesions. An additional sclerotic lesion in the left femoral neck was non-specific but may have simply represented a bone island given the lack of activity on above bone scan. There was no other definite extra nodal or extraskeletal metastatic disease identified elsewhere in the chest, abdomen, or pelvis. However, there was a small cavitary lesion with some mural thickening in the left lower lobe that appeared slowly progressive compared to remote prior study from 2017.  She denies ulcers or wounds over her breasts.  She denies bone pain other than post op right hip pain.  PREVIOUS RADIATION THERAPY: No  PAST MEDICAL HISTORY:  has a past medical history of Arthritis, Breast cancer ([place]), and History of kidney stones.    PAST SURGICAL HISTORY: Past Surgical History:  Procedure Laterality Date  . ABDOMINAL HYSTERECTOMY    . APPENDECTOMY    . BREAST LUMPECTOMY WITH RADIOACTIVE SEED LOCALIZATION Right 03/20/2016   Procedure: RIGHT BREAST LUMPECTOMY WITH RADIOACTIVE SEED LOCALIZATION, AND RIGHT BREAST PARTIAL MASTECTOMY;  Surgeon: Coralie Keens, MD;  Location: [facility];  Service: General;  Laterality: Right;  . BREAST SURGERY Left    breast lumpectomy  . CATARACT EXTRACTION, BILATERAL    . KIDNEY STONE SURGERY    . TOTAL ABDOMINAL HYSTERECTOMY W/ BILATERAL SALPINGOOPHORECTOMY    . TOTAL HIP ARTHROPLASTY Right 11/22/2020   Procedure: RIGHT TOTAL HIP ARTHROPLASTY ANTERIOR  APPROACH;  Surgeon: Mcarthur Rossetti, MD;  Location: [facility];  Service: Orthopedics;  Laterality: Right;  . WRIST FRACTURE SURGERY Right     FAMILY HISTORY: family history is not on file.  SOCIAL HISTORY:  reports that she quit smoking about 16 years ago. She has never used smokeless tobacco. She reports that she does not drink alcohol and does not use drugs.  ALLERGIES: Codeine  MEDICATIONS:  Current Outpatient Medications  Medication Sig Dispense Refill  . ibuprofen (ADVIL,MOTRIN) 200 MG tablet Take 400 mg by mouth every 6 (six) hours as needed for moderate pain.    Marland Kitchen letrozole (FEMARA) 2.5 MG tablet Take 1 tablet (2.5 mg total) by mouth daily. 90 tablet 3   No current facility-administered medications for this encounter.    REVIEW OF SYSTEMS: As above   PHYSICAL EXAM:  weight is 145 lb 2 oz (65.8 kg). Her oral temperature is 98.1 F (36.7 C). Her blood pressure is 157/80 (abnormal) and her pulse is 90. Her respiration is 19 and oxygen saturation is 96%.   General:  in no acute distress   ECOG = 3  0 - Asymptomatic (Fully active, able to carry on all predisease activities without restriction)  1 - Symptomatic but completely ambulatory (Restricted in physically strenuous activity but ambulatory and able to carry out work of a light or sedentary nature. For example, light housework, office work)  2 - Symptomatic, <50% in bed during the day (Ambulatory and capable of all self care but unable to carry out any work activities. Up and about more than 50% of waking hours)  3 -  Symptomatic, >50% in bed, but not bedbound (Capable of only limited self-care, confined to bed or chair 50% or more of waking hours)  4 - Bedbound (Completely disabled. Cannot carry on any self-care. Totally confined to bed or chair)  5 - Death   Eustace Pen MM, Creech RH, Tormey DC, et al. [phone]). "Toxicity and response criteria of the [facility]". [place] Oncol. 5 (6):  649-55   LABORATORY DATA:  Lab Results  Component Value Date   WBC 7.8 11/23/2020   HGB 10.9 (L) 11/23/2020   HCT 34.3 (L) 11/23/2020   MCV 91.2 11/23/2020   PLT 177 11/23/2020   CMP     Component Value Date/Time   NA 139 11/23/2020 0338   NA 143 02/02/2016 1544   K 4.0 11/23/2020 0338   K 3.9 02/02/2016 1544   CL 103 11/23/2020 0338   CO2 29 11/23/2020 0338   CO2 28 02/02/2016 1544   GLUCOSE 106 (H) 11/23/2020 0338   GLUCOSE 99 02/02/2016 1544   BUN 14 11/23/2020 0338   BUN 18.2 02/02/2016 1544   CREATININE 0.73 11/23/2020 0338   CREATININE 0.88 10/30/2018 1100   CREATININE 0.9 02/02/2016 1544   CALCIUM 8.1 (L) 11/23/2020 0338   CALCIUM 9.4 02/02/2016 1544   PROT 7.0 11/18/2020 0817   PROT 7.6 02/02/2016 1544   ALBUMIN 3.7 11/18/2020 0817   ALBUMIN 3.8 02/02/2016 1544   AST 16 11/18/2020 0817   AST 17 02/02/2016 1544   ALT 16 11/18/2020 0817   ALT 16 02/02/2016 1544   ALKPHOS 80 11/18/2020 0817   ALKPHOS 105 02/02/2016 1544   BILITOT 0.6 11/18/2020 0817   BILITOT 0.32 02/02/2016 1544   GFRNONAA >60 11/23/2020 0338   GFRAA  05/04/2010 0846    >60        The eGFR has been calculated using the MDRD equation. This calculation has not been validated in all clinical situations. eGFR's persistently <60 mL/min signify possible Chronic Kidney Disease.         RADIOGRAPHY: as above     IMPRESSION/PLAN: Metastatic breast cancer to right hip  It was a pleasure meeting the patient today. We discussed the risks, benefits, and side effects of radiotherapy to the right hip/femur with palliative intent to reduce the risk of local progression of her bone cancer. I recommend radiotherapy in 5 fractions over 1 week. We   We spoke about acute effects including skin irritation and fatigue as well as much less common late effects including damage to the soft tissues or bone.  I look forward to participating in the patient's care. No guarantees of treatment were given.  CT  simulation/treatment planning will occur today and we will start treatment in about a week.  She and her family are pleased with this plan.  On date of service, in total, I spent 30 minutes on this encounter. Patient was seen in person.   __________________________________________   Eppie Gibson, MD  This document serves as a record of services personally performed by Eppie Gibson, MD. It was created on his behalf by Clerance Lav, a trained medical scribe. The creation of this record is based on the scribe's personal observations and the provider's statements to them. This document has been checked and approved by the attending provider.

## 2020-12-26 ENCOUNTER — Encounter: Payer: Self-pay | Admitting: Radiation Oncology

## 2020-12-26 DIAGNOSIS — C7951 Secondary malignant neoplasm of bone: Secondary | ICD-10-CM | POA: Insufficient documentation

## 2020-12-27 DIAGNOSIS — C7951 Secondary malignant neoplasm of bone: Secondary | ICD-10-CM | POA: Diagnosis not present

## 2020-12-27 DIAGNOSIS — Z170 Estrogen receptor positive status [ER+]: Secondary | ICD-10-CM | POA: Diagnosis not present

## 2020-12-27 DIAGNOSIS — Z510 Encounter for antineoplastic radiation therapy: Secondary | ICD-10-CM | POA: Diagnosis not present

## 2020-12-27 DIAGNOSIS — C50311 Malignant neoplasm of lower-inner quadrant of right female breast: Secondary | ICD-10-CM | POA: Diagnosis not present

## 2020-12-29 ENCOUNTER — Ambulatory Visit
Admission: RE | Admit: 2020-12-29 | Discharge: 2020-12-29 | Disposition: A | Discharge status: Home / Self Care | Payer: Medicare Other | Source: Ambulatory Visit | Attending: Radiation Oncology | Admitting: Radiation Oncology

## 2020-12-29 ENCOUNTER — Other Ambulatory Visit: Payer: Self-pay

## 2020-12-29 DIAGNOSIS — Z510 Encounter for antineoplastic radiation therapy: Secondary | ICD-10-CM | POA: Diagnosis not present

## 2020-12-29 DIAGNOSIS — C7951 Secondary malignant neoplasm of bone: Secondary | ICD-10-CM | POA: Diagnosis not present

## 2020-12-29 DIAGNOSIS — Z170 Estrogen receptor positive status [ER+]: Secondary | ICD-10-CM | POA: Diagnosis not present

## 2020-12-29 DIAGNOSIS — C50311 Malignant neoplasm of lower-inner quadrant of right female breast: Secondary | ICD-10-CM | POA: Diagnosis not present

## 2020-12-30 ENCOUNTER — Ambulatory Visit
Admission: RE | Admit: 2020-12-30 | Discharge: 2020-12-30 | Disposition: A | Discharge status: Home / Self Care | Payer: Medicare Other | Source: Ambulatory Visit | Attending: Radiation Oncology | Admitting: Radiation Oncology

## 2020-12-30 DIAGNOSIS — C7951 Secondary malignant neoplasm of bone: Secondary | ICD-10-CM | POA: Diagnosis not present

## 2020-12-30 DIAGNOSIS — Z510 Encounter for antineoplastic radiation therapy: Secondary | ICD-10-CM | POA: Diagnosis not present

## 2020-12-30 DIAGNOSIS — Z170 Estrogen receptor positive status [ER+]: Secondary | ICD-10-CM | POA: Diagnosis not present

## 2020-12-30 DIAGNOSIS — C50311 Malignant neoplasm of lower-inner quadrant of right female breast: Secondary | ICD-10-CM | POA: Diagnosis not present

## 2021-01-02 ENCOUNTER — Ambulatory Visit
Admission: RE | Admit: 2021-01-02 | Discharge: 2021-01-02 | Disposition: A | Discharge status: Home / Self Care | Payer: Medicare Other | Source: Ambulatory Visit | Attending: Radiation Oncology | Admitting: Radiation Oncology

## 2021-01-02 DIAGNOSIS — C50311 Malignant neoplasm of lower-inner quadrant of right female breast: Secondary | ICD-10-CM | POA: Diagnosis not present

## 2021-01-02 DIAGNOSIS — Z510 Encounter for antineoplastic radiation therapy: Secondary | ICD-10-CM | POA: Diagnosis not present

## 2021-01-02 DIAGNOSIS — C7951 Secondary malignant neoplasm of bone: Secondary | ICD-10-CM | POA: Diagnosis not present

## 2021-01-02 DIAGNOSIS — Z170 Estrogen receptor positive status [ER+]: Secondary | ICD-10-CM | POA: Diagnosis not present

## 2021-01-02 NOTE — Progress Notes (Signed)
Patient Care Team: Patient, No Pcp Per as PCP - General (General Practice) Magrinat, [name], MD as Consulting Physician (Oncology) [name], MD as Consulting Physician (Dermatology)  DIAGNOSIS:    ICD-10-CM   1. Malignant neoplasm of lower-inner quadrant of right breast of female, estrogen receptor positive ([place])  C50.311    Z17.0     SUMMARY OF ONCOLOGIC HISTORY: Oncology History  Breast cancer of lower-inner quadrant of right female breast ([place])  01/16/2016 Initial Biopsy   Skin biopsy done by Dr. Denna Haggard found metastatic carcinoma ER/PR +.   02/02/2016 Initial Diagnosis   Breast cancer of lower-inner quadrant of right female breast ([place])   02/02/2016 - 04/10/2016 Neo-Adjuvant Anti-estrogen oral therapy   Anastrozole 84m daily.   02/10/2016 Procedure   CT chest negative for metastatic disease Bone scan negative for metastatic disease   02/14/2016 Mammogram   1.7cm irregular mass in right lower inner quadrant corresponding with known breast cancer skin biopsy.   02/16/2016 Breast MRI   1. Biopsy proven cancer in lower inner quadrant of right breast: 2.8x1.7x1cm, extending to skin surface.  No other evidence of disease in right breast, left breast, or abnormal lymph nodes within the axillary or internal mammary chain regions.   2. Outside ultrasound report recommended additional ultrasound guided biopsy for hypoechoic mass in right breast at 9 o'clock suspected to be intramammary node.     02/22/2016 Procedure   Biopsy of 9 o'clock mass: complex sclerosing lesion with atypical ductal hyperplasia.     03/20/2016 Oncotype testing   Declined oncotype testing   03/20/2016 Surgery   1. Right lumpectomy (9 o'clock lesion): atypical ductal hyperplasia 2. Right partial mastectomy: IDC, grade 1, ER+(100%), PR(15%), Ki-67 5%, HER-2 neg (ratio 1.40), 2.5 cm, with atypical ductal hyperplasia, lymphovascular invasion, margins negative.   04/05/2016 -  Radiation Therapy   Declined radiation  therapy   07/17/2016 -  Anti-estrogen oral therapy   Started Tamoxifen 22mdaily.  A total of 5 years of therapy planned.   10/23/2020 Relapse/Recurrence   Patient has persistent right hip pain. MRI on 10/23/20 after continued pain showed non mass-like hypointensity involving the right femoral head, neck, and intertrochanteric region possibly representing metastasis with superimposed pathologic stress fracture. Bone scan on 11/04/20 showed suspected lesions involving the lateral right third rib and the posterior right fourth rib.    11/22/2020 Surgery   Right hip replacement (BNinfa Linden ER+ metastatic carcinoma consistent with breast origin.    12/30/2020 -  Radiation Therapy   Palliative radiation to right femoral head      CHIEF COMPLIANT:  Follow-up of metastatic breast cancer   INTERVAL HISTORY: BeTIMARA LOMAs a [age].o. with above-mentioned history of metastatic breast cancer currently on treatment with palliative radiation and letrozole. She presents to the clinic today for a toxicity check.  She finished radiation and complains of fatigue is a major side effect.  She is using a cane to get around.  ALLERGIES:  is allergic to codeine.  MEDICATIONS:  Current Outpatient Medications  Medication Sig Dispense Refill   ibuprofen (ADVIL,MOTRIN) 200 MG tablet Take 400 mg by mouth every 6 (six) hours as needed for moderate pain.     letrozole (FEMARA) 2.5 MG tablet Take 1 tablet (2.5 mg total) by mouth daily. 90 tablet 3   No current facility-administered medications for this visit.    PHYSICAL EXAMINATION: ECOG PERFORMANCE STATUS: 2 - Symptomatic, <50% confined to bed  Vitals:   01/03/21 1402  BP: 133/63  Pulse: 87  Resp: 20  Temp: (!) 97.5 F (36.4 C)  SpO2: 96%   Filed Weights   01/03/21 1402  Weight: 145 lb 12.8 oz (66.1 kg)    LABORATORY DATA:  I have reviewed the data as listed CMP Latest Ref Rng & Units 11/23/2020 11/18/2020 11/17/2020  Glucose 70 - 99 mg/dL 106(H) 98 -   BUN 8 - 23 mg/dL 14 16 -  Creatinine 0.44 - 1.00 mg/dL 0.73 1.00 0.70  Sodium 135 - 145 mmol/L 139 143 -  Potassium 3.5 - 5.1 mmol/L 4.0 4.7 -  Chloride 98 - 111 mmol/L 103 106 -  CO2 22 - 32 mmol/L 29 29 -  Calcium 8.9 - 10.3 mg/dL 8.1(L) 9.2 -  Total Protein 6.5 - 8.1 g/dL - 7.0 -  Total Bilirubin 0.3 - 1.2 mg/dL - 0.6 -  Alkaline Phos 38 - 126 U/L - 80 -  AST 15 - 41 U/L - 16 -  ALT 0 - 44 U/L - 16 -    Lab Results  Component Value Date   WBC 7.8 11/23/2020   HGB 10.9 (L) 11/23/2020   HCT 34.3 (L) 11/23/2020   MCV 91.2 11/23/2020   PLT 177 11/23/2020   NEUTROABS 3,754 10/30/2018    ASSESSMENT & PLAN:  Breast cancer of lower-inner quadrant of right female breast ([place]) Metastatic breast cancer MRI right hip 10/23/2020: Stress fracture and suspected early avascular necrosis versus metastatic cancer. Patchy marrow edema in the right femoral head neck and intertrochanteric region.  Bone scan 11/04/2020: Suspected metastatic lesions involving lateral right third rib and posterior right fourth rib. Increased tracer uptake right femoral head and neck and intertrochanteric region corresponding to stress fracture and early AVN  11/18/20: CT CAP: Mildly enl Rt Axill LN 1.2 cm, Mixed lytic and sclerotic lesion Rt femoral head, Rt 3rd rib, Rt T4 transverse process, COPD  Rt Fem Head resection: Met breast cancer ER Pos, PR Pos, Her 2 Neg -------------------------------------------------------------------------------------------------------------------------------------------- Current treatment:  letrozole (Ibrance will be started if the bone scans in 2 months show progression) Palliative radiation to right femoral head started 12/30/2020-01/04/2021  Bone metastases: Recommended giving Xgeva injection along with calcium and vitamin D.  We will set her up when she comes back to see [place] in 2 months.  Return to clinic in 2 months with scans and follow-up    No orders of the defined types  were placed in this encounter.  The patient has a good understanding of the overall plan. she agrees with it. she will call with any problems that may develop before the next visit here.  Total time spent: 30 mins including face to face time and time spent for planning, charting and coordination of care  Rulon Eisenmenger, MD, MPH 01/03/2021  I, Cloyde Reams Dorshimer, am acting as scribe for Dr. Nicholas Lose.  I have reviewed the above documentation for accuracy and completeness, and I agree with the above.

## 2021-01-03 ENCOUNTER — Other Ambulatory Visit: Payer: Self-pay

## 2021-01-03 ENCOUNTER — Ambulatory Visit (INDEPENDENT_AMBULATORY_CARE_PROVIDER_SITE_OTHER): Payer: Medicare Other | Admitting: Orthopaedic Surgery

## 2021-01-03 ENCOUNTER — Encounter: Payer: Self-pay | Admitting: Orthopaedic Surgery

## 2021-01-03 ENCOUNTER — Inpatient Hospital Stay: Payer: Medicare Other | Admitting: Hematology and Oncology

## 2021-01-03 ENCOUNTER — Ambulatory Visit
Admission: RE | Admit: 2021-01-03 | Discharge: 2021-01-03 | Disposition: A | Discharge status: Home / Self Care | Payer: Medicare Other | Source: Ambulatory Visit | Attending: Radiation Oncology | Admitting: Radiation Oncology

## 2021-01-03 ENCOUNTER — Telehealth: Payer: Self-pay | Admitting: Hematology and Oncology

## 2021-01-03 VITALS — BP 133/63 | HR 87 | Temp 97.5°F | Resp 20 | Ht 62.0 in | Wt 145.8 lb

## 2021-01-03 DIAGNOSIS — J449 Chronic obstructive pulmonary disease, unspecified: Secondary | ICD-10-CM | POA: Diagnosis not present

## 2021-01-03 DIAGNOSIS — Z7982 Long term (current) use of aspirin: Secondary | ICD-10-CM | POA: Diagnosis not present

## 2021-01-03 DIAGNOSIS — Z79899 Other long term (current) drug therapy: Secondary | ICD-10-CM | POA: Diagnosis not present

## 2021-01-03 DIAGNOSIS — Z79811 Long term (current) use of aromatase inhibitors: Secondary | ICD-10-CM | POA: Diagnosis not present

## 2021-01-03 DIAGNOSIS — C7951 Secondary malignant neoplasm of bone: Secondary | ICD-10-CM | POA: Diagnosis not present

## 2021-01-03 DIAGNOSIS — C50311 Malignant neoplasm of lower-inner quadrant of right female breast: Secondary | ICD-10-CM

## 2021-01-03 DIAGNOSIS — Z170 Estrogen receptor positive status [ER+]: Secondary | ICD-10-CM

## 2021-01-03 DIAGNOSIS — Z923 Personal history of irradiation: Secondary | ICD-10-CM | POA: Diagnosis not present

## 2021-01-03 DIAGNOSIS — Z791 Long term (current) use of non-steroidal anti-inflammatories (NSAID): Secondary | ICD-10-CM | POA: Diagnosis not present

## 2021-01-03 DIAGNOSIS — Z510 Encounter for antineoplastic radiation therapy: Secondary | ICD-10-CM | POA: Diagnosis not present

## 2021-01-03 DIAGNOSIS — Z96641 Presence of right artificial hip joint: Secondary | ICD-10-CM

## 2021-01-03 NOTE — Assessment & Plan Note (Signed)
Metastatic breast cancer MRI right hip 10/23/2020: Stress fracture and suspected early avascular necrosis versus metastatic cancer. Patchy marrow edema in the right femoral head neck and intertrochanteric region.  Bone scan 11/04/2020: Suspected metastatic lesions involving lateral right third rib and posterior right fourth rib. Increased tracer uptake right femoral head and neck and intertrochanteric region corresponding to stress fracture and early AVN  11/18/20: CT CAP: Mildly enl Rt Axill LN 1.2 cm, Mixed lytic and sclerotic lesion Rt femoral head, Rt 3rd rib, Rt T4 transverse process, COPD  Rt Fem Head resection: Met breast cancer ER Pos, PR Pos, Her 2 Neg -------------------------------------------------------------------------------------------------------------------------------------------- Current treatment: Ibrance with letrozole Leslee Home will be started once radiation is complete) Palliative radiation to right femoral head started 12/30/2020-01/04/2021  Ibrance: I discussed the risks and benefits of Ibrance including myelosuppression especially neutropenia and with that risk of infection, there is risk of pulmonary embolism and mild peripheral neuropathy as well. Fatigue, nausea, diarrhea, decreased appetite as well as alopecia and thrombocytopenia are also potential side effects of Ibrance  Return to clinic in 2 weeks with labs and follow-up

## 2021-01-03 NOTE — Progress Notes (Signed)
The patient comes in today at about 6 weeks status post a right total hip arthroplasty.  This was secondary to metastatic cancer in that area.  She is having radiation treatment now to her right hip.  She is ambulate a cane.  She says she is doing well slowly but the radiation does make her sore.  Examination of her right hip shows it does move smoothly and fluidly.  I did look at the incision to make sure everything looks good there and it does.  There is no redness or evidence of infection.  She will continue to increase her activities as she tolerates from my standpoint.  I would like to see her back in 6 weeks.  At that visit I would like an AP and lateral of her right hip so we can just make sure the prosthesis and components are looking well-seated in light of her cancer diagnosis and treatment.

## 2021-01-03 NOTE — Telephone Encounter (Signed)
Scheduled appts per 3/22 los. Pt aware.  

## 2021-01-04 ENCOUNTER — Ambulatory Visit
Admission: RE | Admit: 2021-01-04 | Discharge: 2021-01-04 | Disposition: A | Discharge status: Home / Self Care | Payer: Medicare Other | Source: Ambulatory Visit | Attending: Radiation Oncology | Admitting: Radiation Oncology

## 2021-01-04 ENCOUNTER — Encounter: Payer: Self-pay | Admitting: Radiation Oncology

## 2021-01-04 DIAGNOSIS — Z510 Encounter for antineoplastic radiation therapy: Secondary | ICD-10-CM | POA: Diagnosis not present

## 2021-01-04 DIAGNOSIS — Z170 Estrogen receptor positive status [ER+]: Secondary | ICD-10-CM | POA: Diagnosis not present

## 2021-01-04 DIAGNOSIS — C50311 Malignant neoplasm of lower-inner quadrant of right female breast: Secondary | ICD-10-CM | POA: Diagnosis not present

## 2021-01-04 DIAGNOSIS — C7951 Secondary malignant neoplasm of bone: Secondary | ICD-10-CM | POA: Diagnosis not present

## 2021-02-06 ENCOUNTER — Telehealth: Payer: Self-pay

## 2021-02-06 NOTE — Telephone Encounter (Signed)
I called the patient today about their upcoming follow-up appointment in radiation oncology.   Given the state of the  COVID-19 pandemic, concerning case numbers in our community, and guidance from [facility], I offered a phone assessment with the patient to determine if coming to the clinic was necessary. The patient accepted.  I let the patient know that I had spoken with Dr. Isidore Moos, and she wanted them to know the importance of washing their hands for at least 20 seconds at a time, especially after going out in public, and before they eat. Limit going out in public whenever possible. Do not touch your face, unless your hands are clean, such as when bathing. Get plenty of rest, eat well, and stay hydrated. Patient verbalized understanding and agreement  Symptomatically, the patient is doing relatively well. She reports her hip pain has resolved and she is able to ambulate without the use of a cane or walker. She reports regular bowel movements and denies any changes in her urinary habits. Overall she reports she feels good, and is pleased with her recovery since completing radiation.  All questions were answered to the patient's satisfaction.  I encouraged the patient to call with any further questions. Otherwise, the plan is continue follow-up with medical oncology as scheduled on 03/07/2021 (patient also aware of follow-up with Dr. Ninfa Linden on 02/14/2021). Patient mentioned she thought she is meant to have a bone scan soon, but has not been notified by Dr. Geralyn Flash office with a date/time. Informed patient that it was most likely an authorization issue since patient's follow-up is in about a month, but assured her I would reach out to Dr. Lindi Adie and his team to verify.   Patient verbalized appreciation of care received by Dr. Isidore Moos is pleased with this plan. We will cancel their upcoming follow-up to reduce the risk of COVID-19 transmission.

## 2021-02-07 ENCOUNTER — Ambulatory Visit: Payer: Self-pay | Admitting: Radiation Oncology

## 2021-02-14 ENCOUNTER — Ambulatory Visit (INDEPENDENT_AMBULATORY_CARE_PROVIDER_SITE_OTHER): Payer: Medicare Other

## 2021-02-14 ENCOUNTER — Encounter: Payer: Self-pay | Admitting: Orthopaedic Surgery

## 2021-02-14 ENCOUNTER — Ambulatory Visit (INDEPENDENT_AMBULATORY_CARE_PROVIDER_SITE_OTHER): Payer: Medicare Other | Admitting: Orthopaedic Surgery

## 2021-02-14 DIAGNOSIS — Z96641 Presence of right artificial hip joint: Secondary | ICD-10-CM | POA: Diagnosis not present

## 2021-02-14 NOTE — Progress Notes (Signed)
Jessica [name] comes in today after having a right total hip arthroplasty in late February of this year on her right hip secondary to avascular necrosis combined with metastasis.  She has had several radiation treatments now for the hip and reports that she is doing great has good motion and strength and no pain.  She is 86 years old.  She is walking without assistive device.  Internal and external rotation and flexion extension of the right hip is normal.  She has no pain around the incision for the hip itself.  An AP pelvis and lateral of the right hip shows a well-seated implant and I see no worrisome features in the bone itself.  From my standpoint I would see her back in the 6 months.  At that visit I would like an AP and lateral of the right hip.  If there is any issues before then they will let us know.

## 2021-02-15 ENCOUNTER — Telehealth: Payer: Self-pay | Admitting: Orthopaedic Surgery

## 2021-02-15 NOTE — Telephone Encounter (Signed)
Err

## 2021-02-21 NOTE — Progress Notes (Signed)
  Patient Name: Jessica [name] MRN: [id number] DOB: April 04, 1935 Referring Physician: Nicholas Lose (Profile Not Attached) Date of Service: 01/04/2021 [facility] [place], [place]                                                        End Of Treatment Note  Diagnoses: C79.51-Secondary malignant neoplasm of bone  Cancer Staging:  STAGE IV BREAST CANCER  Intent: Palliative  Radiation Treatment Dates: 12/29/2020 through 01/04/2021 Site Technique Total Dose (Gy) Dose per Fx (Gy) Completed Fx Beam Energies  Femur Right: Ext_Rt_hip Complex 20/20 4 5/5 15X   Narrative: The patient tolerated radiation therapy relatively well.   Plan: The patient will follow-up with radiation oncology in 56mo or as needed.  -----------------------------------  Eppie Gibson, MD

## 2021-03-03 ENCOUNTER — Encounter (HOSPITAL_COMMUNITY)
Admission: RE | Admit: 2021-03-03 | Discharge: 2021-03-03 | Disposition: A | Discharge status: Home / Self Care | Payer: Medicare Other | Source: Ambulatory Visit | Attending: Hematology and Oncology | Admitting: Hematology and Oncology

## 2021-03-03 ENCOUNTER — Other Ambulatory Visit: Payer: Self-pay

## 2021-03-03 DIAGNOSIS — C50919 Malignant neoplasm of unspecified site of unspecified female breast: Secondary | ICD-10-CM | POA: Diagnosis not present

## 2021-03-03 DIAGNOSIS — C50311 Malignant neoplasm of lower-inner quadrant of right female breast: Secondary | ICD-10-CM | POA: Diagnosis not present

## 2021-03-03 DIAGNOSIS — Z170 Estrogen receptor positive status [ER+]: Secondary | ICD-10-CM | POA: Insufficient documentation

## 2021-03-03 DIAGNOSIS — C7951 Secondary malignant neoplasm of bone: Secondary | ICD-10-CM | POA: Diagnosis not present

## 2021-03-03 DIAGNOSIS — Z96641 Presence of right artificial hip joint: Secondary | ICD-10-CM | POA: Diagnosis not present

## 2021-03-03 MED ORDER — TECHNETIUM TC 99M MEDRONATE IV KIT
20.0000 | PACK | Freq: Once | INTRAVENOUS | Status: AC | PRN
  Administered 2021-03-03: 20 via INTRAVENOUS

## 2021-03-06 NOTE — Progress Notes (Signed)
Patient Care Team: Patient, No Pcp Per (Inactive) as PCP - General (General Practice) Magrinat, [name], MD as Consulting Physician (Oncology) [name], MD as Consulting Physician (Dermatology)  DIAGNOSIS:    ICD-10-CM   1. Malignant neoplasm of lower-inner quadrant of right breast of female, estrogen receptor positive ([place])  C50.311    Z17.0     SUMMARY OF ONCOLOGIC HISTORY: Oncology History  Breast cancer of lower-inner quadrant of right female breast ([place])  01/16/2016 Initial Biopsy   Skin biopsy done by Dr. Denna Haggard found metastatic carcinoma ER/PR +.   02/02/2016 Initial Diagnosis   Breast cancer of lower-inner quadrant of right female breast ([place])   02/02/2016 - 04/10/2016 Neo-Adjuvant Anti-estrogen oral therapy   Anastrozole $RemoveBefo'1mg'EEdyskCwMXw$  daily.   02/10/2016 Procedure   CT chest negative for metastatic disease Bone scan negative for metastatic disease   02/14/2016 Mammogram   1.7cm irregular mass in right lower inner quadrant corresponding with known breast cancer skin biopsy.   02/16/2016 Breast MRI   1. Biopsy proven cancer in lower inner quadrant of right breast: 2.8x1.7x1cm, extending to skin surface.  No other evidence of disease in right breast, left breast, or abnormal lymph nodes within the axillary or internal mammary chain regions.   2. Outside ultrasound report recommended additional ultrasound guided biopsy for hypoechoic mass in right breast at 9 o'clock suspected to be intramammary node.     02/22/2016 Procedure   Biopsy of 9 o'clock mass: complex sclerosing lesion with atypical ductal hyperplasia.     03/20/2016 Oncotype testing   Declined oncotype testing   03/20/2016 Surgery   1. Right lumpectomy (9 o'clock lesion): atypical ductal hyperplasia 2. Right partial mastectomy: IDC, grade 1, ER+(100%), PR(15%), Ki-67 5%, HER-2 neg (ratio 1.40), 2.5 cm, with atypical ductal hyperplasia, lymphovascular invasion, margins negative.   04/05/2016 -  Radiation Therapy    Declined radiation therapy   07/17/2016 -  Anti-estrogen oral therapy   Started Tamoxifen $RemoveBeforeDEI'20mg'inFQTgIxlZfvNteI$  daily.  A total of 5 years of therapy planned.   10/23/2020 Relapse/Recurrence   Patient has persistent right hip pain. MRI on 10/23/20 after continued pain showed non mass-like hypointensity involving the right femoral head, neck, and intertrochanteric region possibly representing metastasis with superimposed pathologic stress fracture. Bone scan on 11/04/20 showed suspected lesions involving the lateral right third rib and the posterior right fourth rib.    11/22/2020 Surgery   Right hip replacement Ninfa Linden): ER+ metastatic carcinoma consistent with breast origin.    12/30/2020 -  Radiation Therapy   Palliative radiation to right femoral head      CHIEF COMPLIANT: Follow-up of metastatic breast cancer   INTERVAL HISTORY: Jessica [name] is a 86 y.o. with above-mentioned history of metastatic breast cancer currently on treatment with palliative radiation and letrozole. Bone scan on 03/03/21 showed stable uptake in the upper thoracic spine and right lateral third rib, and a new focus of uptake at the right inferior pubic ramus corresponding to possible metastatic lesion. She presents to the clinic today for a toxicity check and to review her scan.  She is able to walk without assistance.  Denies any major pain or discomfort.  Tolerating antiestrogen therapy extremely well.  ALLERGIES:  is allergic to codeine.  MEDICATIONS:  Current Outpatient Medications  Medication Sig Dispense Refill  . ibuprofen (ADVIL,MOTRIN) 200 MG tablet Take 400 mg by mouth every 6 (six) hours as needed for moderate pain.    Marland Kitchen letrozole (FEMARA) 2.5 MG tablet Take 1 tablet (2.5 mg total)  by mouth daily. 90 tablet 3   No current facility-administered medications for this visit.    PHYSICAL EXAMINATION: ECOG PERFORMANCE STATUS: 1 - Symptomatic but completely ambulatory  Vitals:   03/07/21 1103  BP: 139/75  Pulse: 85   Resp: 18  Temp: (!) 97.2 F (36.2 C)  SpO2: 96%   Filed Weights   03/07/21 1103  Weight: 150 lb 6.4 oz (68.2 kg)    LABORATORY DATA:  I have reviewed the data as listed CMP Latest Ref Rng & Units 11/23/2020 11/18/2020 11/17/2020  Glucose 70 - 99 mg/dL 106(H) 98 -  BUN 8 - 23 mg/dL 14 16 -  Creatinine 0.44 - 1.00 mg/dL 0.73 1.00 0.70  Sodium 135 - 145 mmol/L 139 143 -  Potassium 3.5 - 5.1 mmol/L 4.0 4.7 -  Chloride 98 - 111 mmol/L 103 106 -  CO2 22 - 32 mmol/L 29 29 -  Calcium 8.9 - 10.3 mg/dL 8.1(L) 9.2 -  Total Protein 6.5 - 8.1 g/dL - 7.0 -  Total Bilirubin 0.3 - 1.2 mg/dL - 0.6 -  Alkaline Phos 38 - 126 U/L - 80 -  AST 15 - 41 U/L - 16 -  ALT 0 - 44 U/L - 16 -    Lab Results  Component Value Date   WBC 7.8 11/23/2020   HGB 10.9 (L) 11/23/2020   HCT 34.3 (L) 11/23/2020   MCV 91.2 11/23/2020   PLT 177 11/23/2020   NEUTROABS 3,754 10/30/2018    ASSESSMENT & PLAN:  Breast cancer of lower-inner quadrant of right female breast ([place]) Metastatic breast cancer MRI right hip 10/23/2020: Stress fracture and suspected early avascular necrosis versus metastatic cancer. Patchy marrow edema in the right femoral head neck and intertrochanteric region.  Bone scan 11/04/2020: Suspected metastatic lesions involving lateral right third rib and posterior right fourth rib. Increased tracer uptake right femoral head and neck and intertrochanteric region corresponding to stress fracture and early AVN  11/18/20: CT CAP: Mildly enl Rt Axill LN 1.2 cm, Mixed lytic and sclerotic lesion Rt femoral head, Rt 3rd rib, Rt T4 transverse process, COPD  Rt Fem Head resection: Met breast cancer ER Pos, PR Pos, Her 2 Neg -------------------------------------------------------------------------------------------------------------------------------------------- Current treatment:  letrozole (Ibrance will be started if the bone scans in 2 months show progression) Palliative radiation to right femoral head  started 12/30/2020-01/04/2021  Bone metastases: Recommended giving Xgeva injection along with calcium and vitamin D.  We will set her up when she comes back to see [place] in 2 months.  Bone scan 03/05/2021: Stable foci of abnormal uptake in the upper thoracic spine and right lateral third rib.  New focus of uptake in the right inferior pubic ramus. Since this is a solitary lesion, I recommended continuation of the current treatment.  Also it was noted in the February CT scans.  So therefore this is not entirely new lesion.  Return to clinic in 3 months with labs and follow-up Next bone scans will be done in 6 months    No orders of the defined types were placed in this encounter.  The patient has a good understanding of the overall plan. she agrees with it. she will call with any problems that may develop before the next visit here.  Total time spent: 20 mins including face to face time and time spent for planning, charting and coordination of care  Rulon Eisenmenger, MD, MPH 03/07/2021  I, Cloyde Reams Dorshimer, am acting as scribe for Dr. Nicholas Lose.  I  have reviewed the above documentation for accuracy and completeness, and I agree with the above.

## 2021-03-07 ENCOUNTER — Inpatient Hospital Stay: Payer: Medicare Other | Attending: Hematology and Oncology | Admitting: Hematology and Oncology

## 2021-03-07 ENCOUNTER — Other Ambulatory Visit: Payer: Self-pay

## 2021-03-07 ENCOUNTER — Inpatient Hospital Stay: Payer: Medicare Other

## 2021-03-07 DIAGNOSIS — Z79899 Other long term (current) drug therapy: Secondary | ICD-10-CM | POA: Insufficient documentation

## 2021-03-07 DIAGNOSIS — C7951 Secondary malignant neoplasm of bone: Secondary | ICD-10-CM | POA: Diagnosis not present

## 2021-03-07 DIAGNOSIS — Z923 Personal history of irradiation: Secondary | ICD-10-CM | POA: Insufficient documentation

## 2021-03-07 DIAGNOSIS — Z170 Estrogen receptor positive status [ER+]: Secondary | ICD-10-CM | POA: Insufficient documentation

## 2021-03-07 DIAGNOSIS — Z79811 Long term (current) use of aromatase inhibitors: Secondary | ICD-10-CM | POA: Insufficient documentation

## 2021-03-07 DIAGNOSIS — Z791 Long term (current) use of non-steroidal anti-inflammatories (NSAID): Secondary | ICD-10-CM | POA: Insufficient documentation

## 2021-03-07 DIAGNOSIS — C50311 Malignant neoplasm of lower-inner quadrant of right female breast: Secondary | ICD-10-CM | POA: Insufficient documentation

## 2021-03-07 DIAGNOSIS — J449 Chronic obstructive pulmonary disease, unspecified: Secondary | ICD-10-CM | POA: Diagnosis not present

## 2021-03-07 NOTE — Assessment & Plan Note (Signed)
Metastatic breast cancer MRI right hip 10/23/2020: Stress fracture and suspected early avascular necrosis versus metastatic cancer. Patchy marrow edema in the right femoral head neck and intertrochanteric region.  Bone scan 11/04/2020: Suspected metastatic lesions involving lateral right third rib and posterior right fourth rib. Increased tracer uptake right femoral head and neck and intertrochanteric region corresponding to stress fracture and early AVN  11/18/20: CT CAP: Mildly enl Rt Axill LN 1.2 cm, Mixed lytic and sclerotic lesion Rt femoral head, Rt 3rd rib, Rt T4 transverse process, COPD  Rt Fem Head resection: Met breast cancer ER Pos, PR Pos, Her 2 Neg -------------------------------------------------------------------------------------------------------------------------------------------- Current treatment:  letrozole (Ibrance will be started if the bone scans in 2 months show progression) Palliative radiation to right femoral head started 12/30/2020-01/04/2021  Bone metastases: Recommended giving Xgeva injection along with calcium and vitamin D.  We will set her up when she comes back to see [place] in 2 months.  Bone scan 03/05/2021: Stable foci of abnormal uptake in the upper thoracic spine and right lateral third rib.  New focus of uptake in the right inferior pubic ramus. Since this is a solitary lesion, I recommended continuation of the current treatment.  Return to clinic in 3 months with labs and follow-up

## 2021-06-06 NOTE — Progress Notes (Signed)
Patient Care Team: Patient, No Pcp Per (Inactive) as PCP - General (General Practice) Magrinat, [name], MD as Consulting Physician (Oncology) [name], MD as Consulting Physician (Dermatology)  DIAGNOSIS:    ICD-10-CM   1. Bone metastases ([place])  C79.51 CT CHEST ABDOMEN PELVIS W CONTRAST    NM Bone Scan Whole Body    CBC with Differential (Cancer Center Only)    CMP ([place] only)    2. Malignant neoplasm of lower-inner quadrant of right breast of female, estrogen receptor positive ([place])  C50.311 CT CHEST ABDOMEN PELVIS W CONTRAST   Z17.0 NM Bone Scan Whole Body    CBC with Differential ([place])    CMP ([place] only)      SUMMARY OF ONCOLOGIC HISTORY: Oncology History  Breast cancer of lower-inner quadrant of right female breast ([place])  01/16/2016 Initial Biopsy   Skin biopsy done by Dr. Denna Haggard found metastatic carcinoma ER/PR +.   02/02/2016 Initial Diagnosis   Breast cancer of lower-inner quadrant of right female breast ([place])   02/02/2016 - 04/10/2016 Neo-Adjuvant Anti-estrogen oral therapy   Anastrozole 49m daily.   02/10/2016 Procedure   CT chest negative for metastatic disease Bone scan negative for metastatic disease   02/14/2016 Mammogram   1.7cm irregular mass in right lower inner quadrant corresponding with known breast cancer skin biopsy.   02/16/2016 Breast MRI   1. Biopsy proven cancer in lower inner quadrant of right breast: 2.8x1.7x1cm, extending to skin surface.  No other evidence of disease in right breast, left breast, or abnormal lymph nodes within the axillary or internal mammary chain regions.   2. Outside ultrasound report recommended additional ultrasound guided biopsy for hypoechoic mass in right breast at 9 o'clock suspected to be intramammary node.     02/22/2016 Procedure   Biopsy of 9 o'clock mass: complex sclerosing lesion with atypical ductal hyperplasia.     03/20/2016 Oncotype testing   Declined oncotype testing    03/20/2016 Surgery   1. Right lumpectomy (9 o'clock lesion): atypical ductal hyperplasia 2. Right partial mastectomy: IDC, grade 1, ER+(100%), PR(15%), Ki-67 5%, HER-2 neg (ratio 1.40), 2.5 cm, with atypical ductal hyperplasia, lymphovascular invasion, margins negative.   04/05/2016 -  Radiation Therapy   Declined radiation therapy   07/17/2016 -  Anti-estrogen oral therapy   Started Tamoxifen 218mdaily.  A total of 5 years of therapy planned.   10/23/2020 Relapse/Recurrence   Patient has persistent right hip pain. MRI on 10/23/20 after continued pain showed non mass-like hypointensity involving the right femoral head, neck, and intertrochanteric region possibly representing metastasis with superimposed pathologic stress fracture. Bone scan on 11/04/20 showed suspected lesions involving the lateral right third rib and the posterior right fourth rib.    11/22/2020 Surgery   Right hip replacement ([facility] ER+ metastatic carcinoma consistent with breast origin.    12/30/2020 -  Radiation Therapy   Palliative radiation to right femoral head      CHIEF COMPLIANT: Follow-up of metastatic breast cancer   INTERVAL HISTORY: Jessica [name] a [age].o. with above-mentioned history of metastatic breast cancer currently on treatment with palliative radiation and letrozole. She presents to the clinic today for follow-up.  She is complaining of intermittent bone pain.  ALLERGIES:  is allergic to codeine.  MEDICATIONS:  Current Outpatient Medications  Medication Sig Dispense Refill   ibuprofen (ADVIL,MOTRIN) 200 MG tablet Take 400 mg by mouth every 6 (six) hours as needed for moderate pain.  letrozole (FEMARA) 2.5 MG tablet Take 1 tablet (2.5 mg total) by mouth daily. 90 tablet 3   No current facility-administered medications for this visit.    PHYSICAL EXAMINATION: ECOG PERFORMANCE STATUS: 2 - Symptomatic, <50% confined to bed  Vitals:   06/07/21 1031  BP: (!) 147/72  Pulse: 81  Resp: 18   Temp: (!) 97.4 F (36.3 C)  SpO2: 97%   Filed Weights   06/07/21 1031  Weight: 152 lb 1.6 oz (69 kg)      LABORATORY DATA:  I have reviewed the data as listed CMP Latest Ref Rng & Units 11/23/2020 11/18/2020 11/17/2020  Glucose 70 - 99 mg/dL 106(H) 98 -  BUN 8 - 23 mg/dL 14 16 -  Creatinine 0.44 - 1.00 mg/dL 0.73 1.00 0.70  Sodium 135 - 145 mmol/L 139 143 -  Potassium 3.5 - 5.1 mmol/L 4.0 4.7 -  Chloride 98 - 111 mmol/L 103 106 -  CO2 22 - 32 mmol/L 29 29 -  Calcium 8.9 - 10.3 mg/dL 8.1(L) 9.2 -  Total Protein 6.5 - 8.1 g/dL - 7.0 -  Total Bilirubin 0.3 - 1.2 mg/dL - 0.6 -  Alkaline Phos 38 - 126 U/L - 80 -  AST 15 - 41 U/L - 16 -  ALT 0 - 44 U/L - 16 -    Lab Results  Component Value Date   WBC 7.8 11/23/2020   HGB 10.9 (L) 11/23/2020   HCT 34.3 (L) 11/23/2020   MCV 91.2 11/23/2020   PLT 177 11/23/2020   NEUTROABS 3,754 10/30/2018    ASSESSMENT & PLAN:  Breast cancer of lower-inner quadrant of right female breast ([place]) Metastatic breast cancer MRI right hip 10/23/2020: Stress fracture and suspected early avascular necrosis versus metastatic cancer.  Patchy marrow edema in the right femoral head neck and intertrochanteric region.   Bone scan 11/04/2020: Suspected metastatic lesions involving lateral right third rib and posterior right fourth rib.  Increased tracer uptake right femoral head and neck and intertrochanteric region corresponding to stress fracture and early AVN   11/18/20: CT CAP: Mildly enl Rt Axill LN 1.2 cm, Mixed lytic and sclerotic lesion Rt femoral head, Rt 3rd rib, Rt T4 transverse process, COPD   Rt Fem Head resection: Met breast cancer ER Pos, PR Pos, Her 2 Neg -------------------------------------------------------------------------------------------------------------------------------------------- Current treatment:  letrozole (Ibrance will be started if the bone scans in 2 months show progression) Palliative radiation to right femoral head started  12/30/2020-01/04/2021   Bone metastases: Recommended giving Xgeva injection along with calcium and vitamin D.  We will set her up when she comes back to see [place] in 3 months.   Bone scan 03/05/2021: Stable foci of abnormal uptake in the upper thoracic spine and right lateral third rib. New focus of uptake in the right inferior pubic ramus. Since this is a solitary lesion, we continued the current treatment.  Also it was noted in the February CT scans.  So therefore this is not entirely new lesion.   Return to clinic in 3 months with labs and follow-up Next bone scans and CT chest abdomen pelvis will be done in 3 months    Orders Placed This Encounter  Procedures   CT CHEST ABDOMEN PELVIS W CONTRAST    Standing Status:   Future    Standing Expiration Date:   06/07/2022    Order Specific Question:   If indicated for the ordered procedure, I authorize the administration of contrast media per Radiology protocol  Answer:   Yes    Order Specific Question:   Preferred imaging location?    Answer:   [facility]    Order Specific Question:   Release to patient    Answer:   Immediate    Order Specific Question:   Is Oral Contrast requested for this exam?    Answer:   Yes, Per Radiology protocol    Order Specific Question:   Reason for Exam (SYMPTOM  OR DIAGNOSIS REQUIRED)    Answer:   Met breast cancer restaging   NM Bone Scan Whole Body    Standing Status:   Future    Standing Expiration Date:   06/07/2022    Order Specific Question:   If indicated for the ordered procedure, I authorize the administration of a radiopharmaceutical per Radiology protocol    Answer:   Yes    Order Specific Question:   Preferred imaging location?    Answer:   [facility]    Order Specific Question:   Release to patient    Answer:   Immediate   CBC with Differential ([place] Only)    Standing Status:   Future    Standing Expiration Date:   06/07/2022   CMP ([place] only)    Standing  Status:   Future    Standing Expiration Date:   06/07/2022   The patient has a good understanding of the overall plan. she agrees with it. she will call with any problems that may develop before the next visit here.  Total time spent: 20 mins including face to face time and time spent for planning, charting and coordination of care  Rulon Eisenmenger, MD, MPH 06/07/2021  I, Thana Ates, am acting as scribe for Dr. Nicholas Lose.  I have reviewed the above documentation for accuracy and completeness, and I agree with the above.

## 2021-06-06 NOTE — Assessment & Plan Note (Signed)
Metastatic breast cancer MRI right hip 10/23/2020: Stress fracture and suspected early avascular necrosis versus metastatic cancer. Patchy marrow edema in the right femoral head neck and intertrochanteric region.  Bone scan 11/04/2020: Suspected metastatic lesions involving lateral right third rib and posterior right fourth rib. Increased tracer uptake right femoral head and neck and intertrochanteric region corresponding to stress fracture and early AVN  11/18/20: CT CAP: Mildly enl Rt Axill LN 1.2 cm, Mixed lytic and sclerotic lesion Rt femoral head, Rt 3rd rib, Rt T4 transverse process, COPD  Rt Fem Head resection: Met breast cancer ER Pos, PR Pos, Her 2 Neg -------------------------------------------------------------------------------------------------------------------------------------------- Current treatment:letrozole (Ibrance will be started if the bone scans in 2 months show progression) Palliative radiation to right femoral head started 12/30/2020-01/04/2021  Bone metastases: Recommended giving Xgeva injection along with calcium and vitamin D. We will set her up when she comes back to see [place] in 2 months.  Bone scan 03/05/2021: Stable foci of abnormal uptake in the upper thoracic spine and right lateral third rib. New focus of uptake in the right inferior pubic ramus. Since this is a solitary lesion, I recommended continuation of the current treatment.  Also it was noted in the February CT scans.  So therefore this is not entirely new lesion.  Return to clinic in3 monthswith labsand follow-up Next bone scans will be done in 3 months

## 2021-06-07 ENCOUNTER — Other Ambulatory Visit: Payer: Self-pay

## 2021-06-07 ENCOUNTER — Inpatient Hospital Stay: Payer: Medicare Other | Attending: Hematology and Oncology | Admitting: Hematology and Oncology

## 2021-06-07 VITALS — BP 147/72 | HR 81 | Temp 97.4°F | Resp 18 | Ht 62.0 in | Wt 152.1 lb

## 2021-06-07 DIAGNOSIS — Z79899 Other long term (current) drug therapy: Secondary | ICD-10-CM | POA: Diagnosis not present

## 2021-06-07 DIAGNOSIS — Z79811 Long term (current) use of aromatase inhibitors: Secondary | ICD-10-CM | POA: Insufficient documentation

## 2021-06-07 DIAGNOSIS — C7951 Secondary malignant neoplasm of bone: Secondary | ICD-10-CM | POA: Diagnosis not present

## 2021-06-07 DIAGNOSIS — Z923 Personal history of irradiation: Secondary | ICD-10-CM | POA: Insufficient documentation

## 2021-06-07 DIAGNOSIS — Z170 Estrogen receptor positive status [ER+]: Secondary | ICD-10-CM | POA: Diagnosis not present

## 2021-06-07 DIAGNOSIS — C50311 Malignant neoplasm of lower-inner quadrant of right female breast: Secondary | ICD-10-CM

## 2021-08-14 IMAGING — CT CT CHEST-ABD-PELV W/ CM
2 of 5 series · 12 of 36 positions shown, 14 images · IV contrast (omnipaque)
Comparison: Chest CT 02/10/2016. CT the abdomen and pelvis
05/04/2010.

CLINICAL DATA: 85-year-old female with history of right-sided
breast cancer with metastatic disease, status post mastectomy.

EXAM:
CT CHEST, ABDOMEN, AND PELVIS WITH CONTRAST
TECHNIQUE: Multidetector CT imaging of the chest, abdomen and pelvis was
performed following the standard protocol during bolus
administration of intravenous contrast.
CONTRAST:  100mL OMNIPAQUE IOHEXOL 300 MG/ML  SOLN

[Series 2: cap with · axial · 0.97mm/px · z∈[-523,-43]mm · 9 of 122 slices shown, 11 images]
[im 13/122  mediastinal]
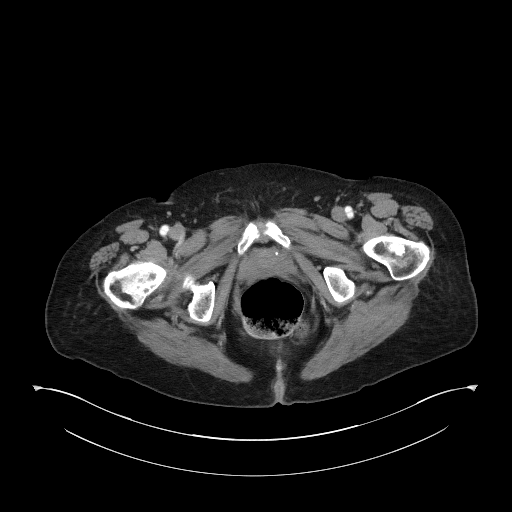
[im 13/122  bone]
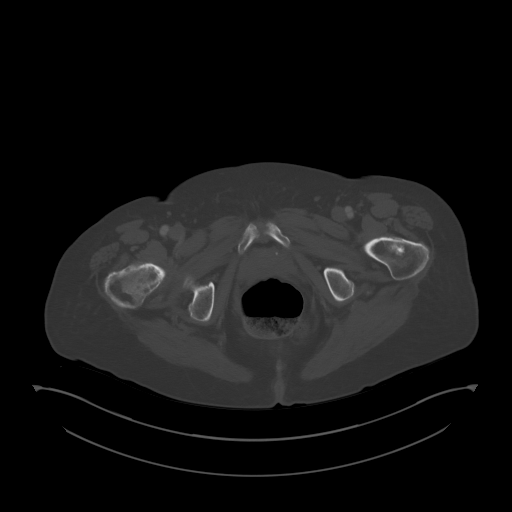
[im 25/122  mediastinal]
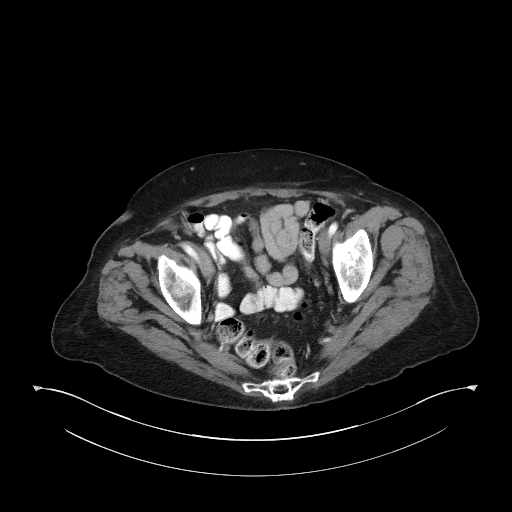
[im 37/122  mediastinal]
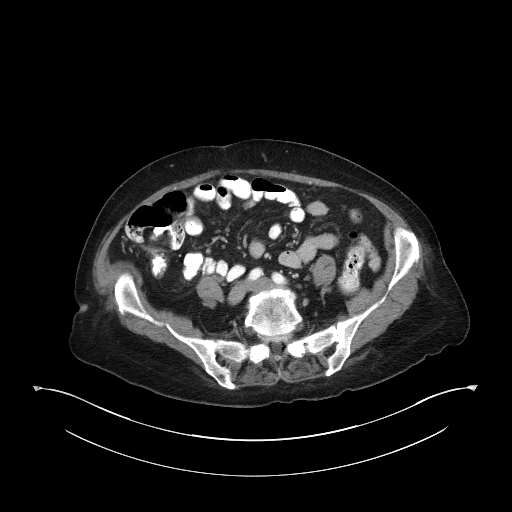
[im 49/122  mediastinal]
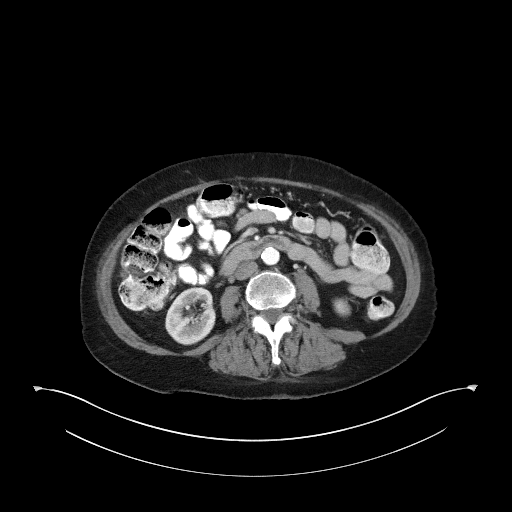
[im 61/122  mediastinal]
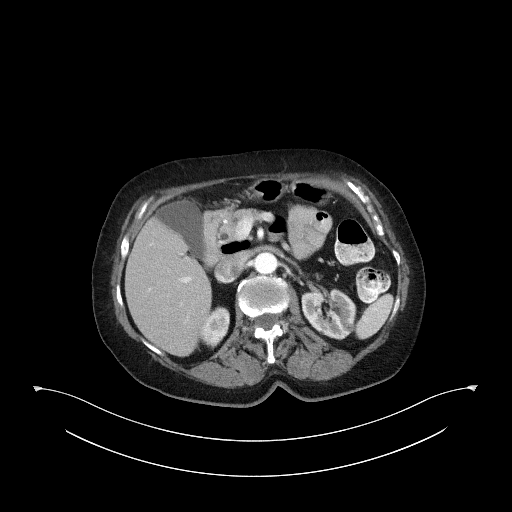
[im 73/122  mediastinal]
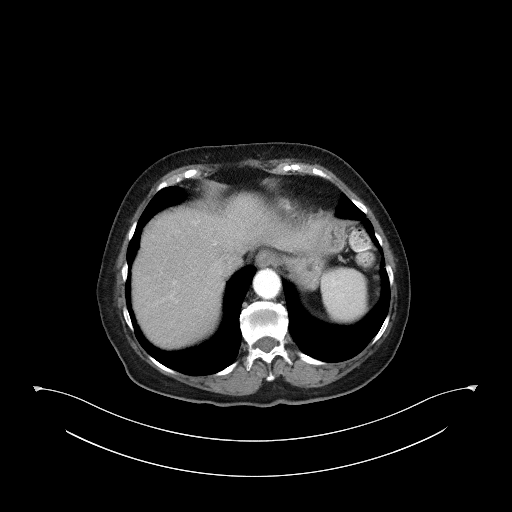
[im 85/122  mediastinal]
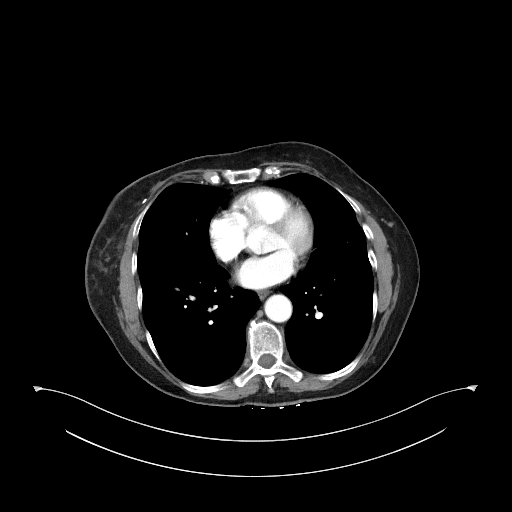
[im 97/122  mediastinal]
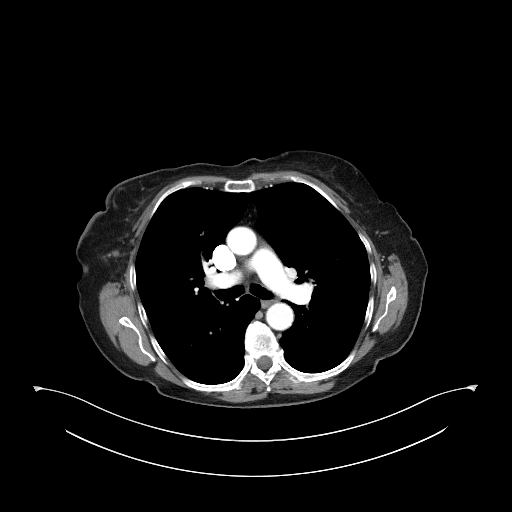
[im 109/122  mediastinal]
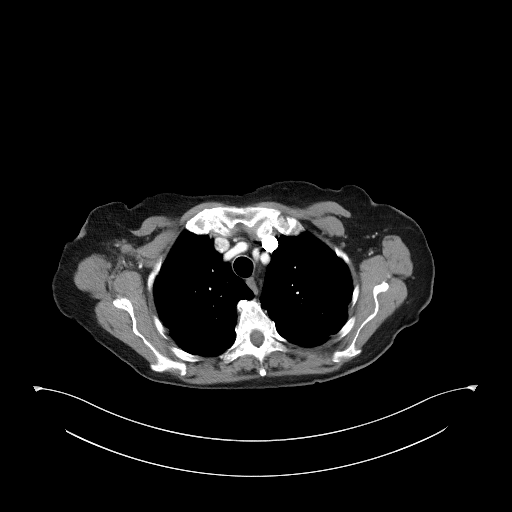
[im 109/122  bone]
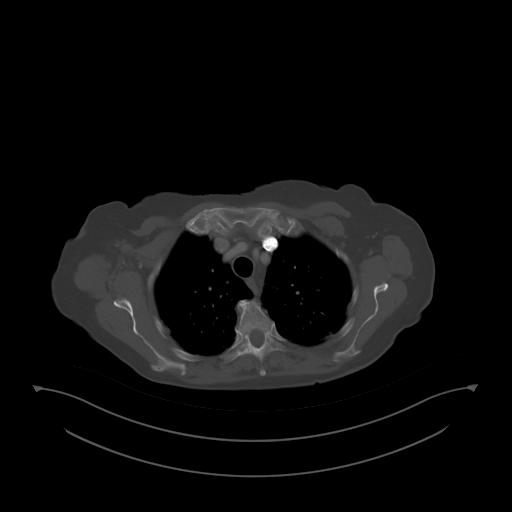

[Series 5: coronals · coronal · 0.91mm/px · 3 of 163 slices shown]
[im 33/163  mediastinal]
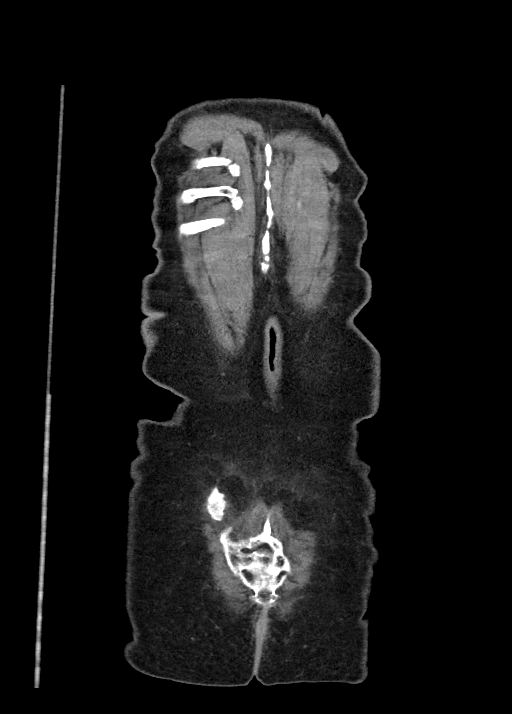
[im 65/163  mediastinal]
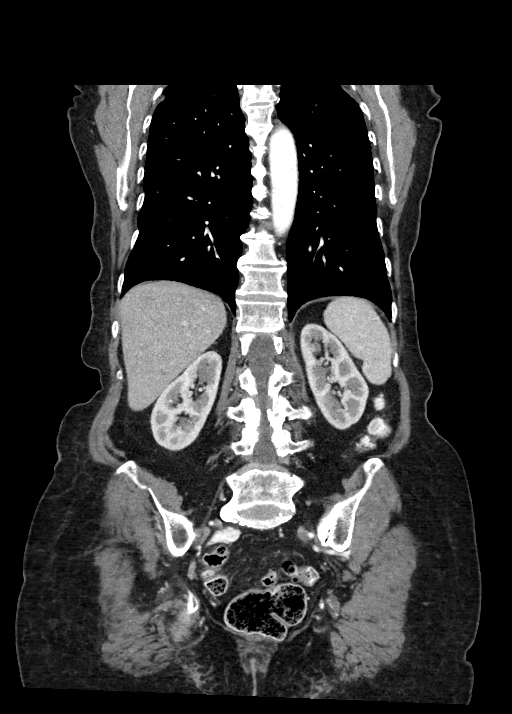
[im 98/163  mediastinal]
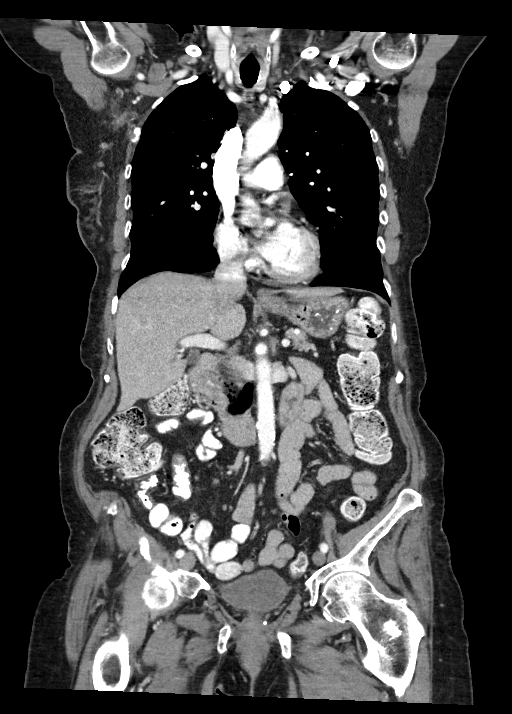

[12 of 36 positions shown; findings below may reference images not displayed]

FINDINGS: CT CHEST FINDINGS

Cardiovascular: Heart size is normal. There is no significant
pericardial fluid, thickening or pericardial calcification. There is
aortic atherosclerosis, as well as atherosclerosis of the great
vessels of the mediastinum and the coronary arteries, including
calcified atherosclerotic plaque in the left anterior descending
coronary artery.

Mediastinum/Nodes: No pathologically enlarged mediastinal or hilar
lymph nodes. Please note that accurate exclusion of hilar adenopathy
is limited on noncontrast CT scans. Esophagus is unremarkable in
appearance. Enlarged ill-defined right axillary lymph node measuring
1.2 cm in short axis (axial image 23 of series 2). Additional 7 mm
short axis lymph node high in the right axilla is nonspecific.

Lungs/Pleura: In the lateral aspect of the left lower lobe (axial
image 98 of series 4) there is an 11 x 7 mm cavitary area with some
mural thickening which is nonspecific, but likely slowly evolved
compared to remote prior study from 02/10/2016, favored to be
sequela of prior infection. No other suspicious appearing pulmonary
nodules or masses are noted. No acute consolidative airspace
disease. No pleural effusions. Bilateral apical pleuroparenchymal
thickening and architectural distortion, similar to prior studies,
most compatible with chronic post infectious or inflammatory
scarring. Diffuse bronchial wall thickening with moderate to severe
centrilobular and paraseptal emphysema.

Musculoskeletal: Postoperative changes in the right breast,
presumably from prior lumpectomy. Mixed lytic and sclerotic areas in
the lateral aspect of the right third rib. Small lytic lesion in the
right T4 transverse process (axial image 13 of series 2).

CT ABDOMEN PELVIS FINDINGS

Hepatobiliary: No suspicious cystic or solid hepatic lesions. No
intra or extrahepatic biliary ductal dilatation. Gallbladder is
normal in appearance.

Pancreas: No pancreatic mass. No pancreatic ductal dilatation. No
pancreatic or peripancreatic fluid collections or inflammatory
changes.

Spleen: Unremarkable.

Adrenals/Urinary Tract: Multiple subcentimeter low-attenuation
lesions in both kidneys, too small to characterize, but
statistically likely to represent tiny cysts. Bilateral adrenal
glands are normal in appearance. No hydroureteronephrosis. Urinary
bladder is normal in appearance.

Stomach/Bowel: Normal appearance of the stomach. No pathologic
dilatation of small bowel or colon. Large duodenal diverticulum
extending off the mid medial aspect of the second portion of the
duodenum measuring approximately 4.3 x 2.2 x 5.5 cm, without
surrounding inflammatory changes to suggest an associated duodenal
diverticulitis at this time. A few scattered colonic diverticulae
are also noted, most evident in the region of the sigmoid colon,
without surrounding inflammatory changes. The appendix is not
confidently identified and may be surgically absent. Regardless,
there are no inflammatory changes noted adjacent to the cecum to
suggest the presence of an acute appendicitis at this time.

Vascular/Lymphatic: Aortic atherosclerosis, without evidence of
aneurysm or dissection in the abdominal or pelvic vasculature. No
lymphadenopathy noted in the abdomen or pelvis.

Reproductive: Status post hysterectomy. Ovaries may be surgically
absent are not confidently identified or atrophic.

Other: No significant volume of ascites.  No pneumoperitoneum.

Musculoskeletal: Infiltrative mixed lytic and sclerotic lesion in
the right femoral head and neck with what appears to be a
nondisplaced pathologic fracture. Sclerotic lesion measuring 1.3 cm
in the left femoral neck (axial image 109 of series 2).
IMPRESSION: 1. Mildly enlarged right axillary lymph node with ill-defined
margins measuring 1.2 cm in diameter, concerning for local nodal
metastasis. In addition, there is a mixed lytic and sclerotic lesion
in the right femoral head and neck with a nondisplaced pathologic
fracture which likely represents a metastatic lesion, as well as
lesions in the lateral aspect of the right third rib and right T4
transverse process, which both likely represent metastatic lesions.
An additional sclerotic lesion in the left femoral neck is
nonspecific, but may simply represent a bone island given the lack
of activity on recent whole-body bone scan.
2. No other definite extra nodal or extraskeletal metastatic disease
identified elsewhere in the chest, abdomen or pelvis.
3. Small cavitary lesion with some mural thickening in the left
lower lobe which appears slowly progressive compared to remote prior
study from 9813. This may simply represent an area of chronic post
infectious or inflammatory scarring, however, continued attention to
this region on follow-up studies is recommended to ensure stability.
4. Diffuse bronchial wall thickening with moderate to severe
centrilobular and paraseptal emphysema; imaging findings suggestive
of underlying COPD.
5. Aortic atherosclerosis, in addition to left anterior descending
coronary artery disease.
6. Colonic diverticulosis without evidence of acute diverticulitis
at this time.
7. Large duodenal diverticulum, as above, without evidence to
suggest an associated duodenal diverticulitis.

## 2021-08-16 ENCOUNTER — Ambulatory Visit: Payer: Self-pay

## 2021-08-16 ENCOUNTER — Ambulatory Visit (INDEPENDENT_AMBULATORY_CARE_PROVIDER_SITE_OTHER): Payer: Medicare Other | Admitting: Orthopaedic Surgery

## 2021-08-16 ENCOUNTER — Encounter: Payer: Self-pay | Admitting: Orthopaedic Surgery

## 2021-08-16 DIAGNOSIS — Z96641 Presence of right artificial hip joint: Secondary | ICD-10-CM | POA: Diagnosis not present

## 2021-08-16 NOTE — Progress Notes (Signed)
The patient is well-known to me.  She is 9 months out from a right total hip arthroplasty that was done secondary to metastatic lesion in the proximal femur at the femoral neck area.  She is 86 years old and still very active.  She is not walking with an assistive device and says she is doing great.  She is on medications as a relates to her cancer and does have follow-up seen for that.  She reports only some slight numbness around the hip and says that she is again doing well overall.  She is walking without any type of assistive device.  She does not have a limp either with her gait.  Her right operative hip moves smoothly and fluidly with no issues at all.  Her left hip also moves smoothly and has no hip pain or thigh pain on either side.  An AP pelvis and lateral of the right hip shows a well-seated total hip arthroplasty that is becoming bone ingrown.  I see no additional lesions around either femur or pelvis on plain film.  From my standpoint follow-up can be as needed.  If she does develop any issues at all orthopedic wise she knows to let us know she has been a long-term patient of ours.

## 2021-08-19 IMAGING — DX DG PORTABLE PELVIS
1 series · 1 of 1 positions shown · non-contrast
Comparison: Right hip radiograph dated 10/19/2020.

CLINICAL DATA: 85-year-old female status post total right hip
arthroplasty.

EXAM:
PORTABLE PELVIS 1-2 VIEWS

[pelvis ap]
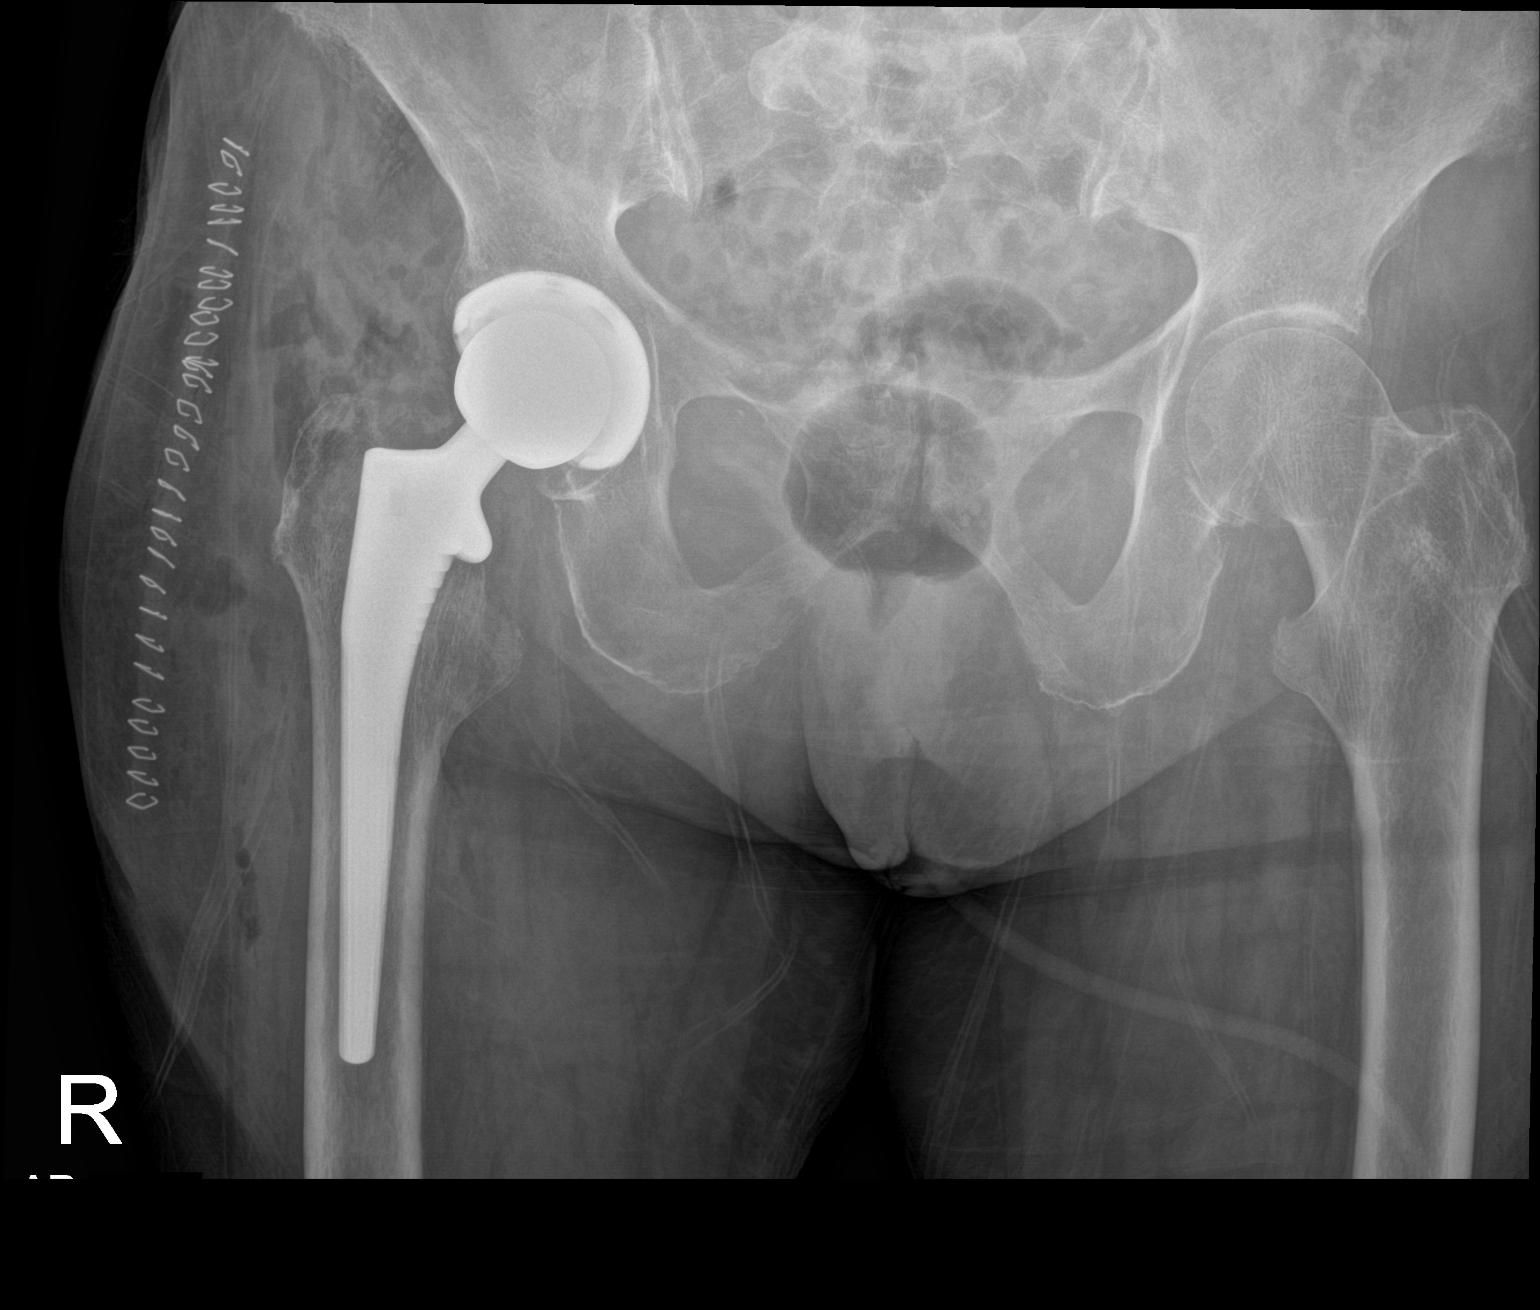

[1 of 1 positions shown; findings below may reference images not displayed]

FINDINGS: There is a total right hip arthroplasty. The arthroplasty components
appear intact and in anatomic alignment. There is no acute fracture
or dislocation. The bones are osteopenic. Postsurgical changes in
the soft tissues of the right hip and cutaneous clips.
IMPRESSION: Satisfactory appearance of the total right hip arthroplasty.

## 2021-09-06 ENCOUNTER — Ambulatory Visit (HOSPITAL_COMMUNITY)
Admission: RE | Admit: 2021-09-06 | Discharge: 2021-09-06 | Disposition: A | Discharge status: Home / Self Care | Payer: Medicare Other | Source: Ambulatory Visit | Attending: Hematology and Oncology | Admitting: Hematology and Oncology

## 2021-09-06 ENCOUNTER — Encounter (HOSPITAL_COMMUNITY): Payer: Self-pay

## 2021-09-06 ENCOUNTER — Inpatient Hospital Stay: Payer: Medicare Other | Attending: Hematology and Oncology

## 2021-09-06 ENCOUNTER — Other Ambulatory Visit: Payer: Self-pay

## 2021-09-06 DIAGNOSIS — C50919 Malignant neoplasm of unspecified site of unspecified female breast: Secondary | ICD-10-CM | POA: Diagnosis not present

## 2021-09-06 DIAGNOSIS — Z170 Estrogen receptor positive status [ER+]: Secondary | ICD-10-CM | POA: Insufficient documentation

## 2021-09-06 DIAGNOSIS — M19011 Primary osteoarthritis, right shoulder: Secondary | ICD-10-CM | POA: Diagnosis not present

## 2021-09-06 DIAGNOSIS — C7951 Secondary malignant neoplasm of bone: Secondary | ICD-10-CM | POA: Insufficient documentation

## 2021-09-06 DIAGNOSIS — C50311 Malignant neoplasm of lower-inner quadrant of right female breast: Secondary | ICD-10-CM | POA: Insufficient documentation

## 2021-09-06 DIAGNOSIS — M19012 Primary osteoarthritis, left shoulder: Secondary | ICD-10-CM | POA: Diagnosis not present

## 2021-09-06 DIAGNOSIS — I2510 Atherosclerotic heart disease of native coronary artery without angina pectoris: Secondary | ICD-10-CM | POA: Diagnosis not present

## 2021-09-06 DIAGNOSIS — K5730 Diverticulosis of large intestine without perforation or abscess without bleeding: Secondary | ICD-10-CM | POA: Diagnosis not present

## 2021-09-06 DIAGNOSIS — I700 Atherosclerosis of aorta: Secondary | ICD-10-CM | POA: Diagnosis not present

## 2021-09-06 LAB — CBC WITH DIFFERENTIAL (CANCER CENTER ONLY)
Abs Immature Granulocytes: 0.01 10*3/uL (ref 0.00–0.07)
Basophils Absolute: 0.1 10*3/uL (ref 0.0–0.1)
Basophils Relative: 1 %
Eosinophils Absolute: 0.1 10*3/uL (ref 0.0–0.5)
Eosinophils Relative: 1 %
HCT: 45.3 % (ref 36.0–46.0)
Hemoglobin: 14.7 g/dL (ref 12.0–15.0)
Immature Granulocytes: 0 %
Lymphocytes Relative: 15 %
Lymphs Abs: 0.9 10*3/uL (ref 0.7–4.0)
MCH: 28.7 pg (ref 26.0–34.0)
MCHC: 32.5 g/dL (ref 30.0–36.0)
MCV: 88.3 fL (ref 80.0–100.0)
Monocytes Absolute: 0.3 10*3/uL (ref 0.1–1.0)
Monocytes Relative: 6 %
Neutro Abs: 4.4 10*3/uL (ref 1.7–7.7)
Neutrophils Relative %: 77 %
Platelet Count: 226 10*3/uL (ref 150–400)
RBC: 5.13 MIL/uL — ABNORMAL HIGH (ref 3.87–5.11)
RDW: 13.2 % (ref 11.5–15.5)
WBC Count: 5.7 10*3/uL (ref 4.0–10.5)
nRBC: 0 % (ref 0.0–0.2)

## 2021-09-06 LAB — CMP (CANCER CENTER ONLY)
ALT: 15 U/L (ref 0–44)
AST: 16 U/L (ref 15–41)
Albumin: 3.9 g/dL (ref 3.5–5.0)
Alkaline Phosphatase: 110 U/L (ref 38–126)
Anion gap: 9 (ref 5–15)
BUN: 13 mg/dL (ref 8–23)
CO2: 27 mmol/L (ref 22–32)
Calcium: 9.3 mg/dL (ref 8.9–10.3)
Chloride: 105 mmol/L (ref 98–111)
Creatinine: 0.82 mg/dL (ref 0.44–1.00)
GFR, Estimated: >60 mL/min (ref >60)
Glucose, Bld: 113 mg/dL — ABNORMAL HIGH (ref 70–99)
Potassium: 4.2 mmol/L (ref 3.5–5.1)
Sodium: 141 mmol/L (ref 135–145)
Total Bilirubin: 0.4 mg/dL (ref 0.3–1.2)
Total Protein: 7.9 g/dL (ref 6.5–8.1)

## 2021-09-06 MED ORDER — TECHNETIUM TC 99M MEDRONATE IV KIT
20.2000 | PACK | Freq: Once | INTRAVENOUS | Status: AC | PRN
  Administered 2021-09-06: 20.2 via INTRAVENOUS

## 2021-09-06 MED ORDER — IOHEXOL 350 MG/ML SOLN
75.0000 mL | Freq: Once | INTRAVENOUS | Status: AC | PRN
  Administered 2021-09-06: 75 mL via INTRAVENOUS

## 2021-09-06 MED ORDER — SODIUM CHLORIDE (PF) 0.9 % IJ SOLN
INTRAMUSCULAR | Status: AC
  Filled 2021-09-06: qty 50

## 2021-09-10 NOTE — Progress Notes (Signed)
Patient Care Team: System, Provider Not In as PCP - General Magrinat, [name], MD as Consulting Physician (Oncology) [name], MD as Consulting Physician (Dermatology)  DIAGNOSIS:    ICD-10-CM   1. Malignant neoplasm of lower-inner quadrant of right breast of female, estrogen receptor positive ([place])  C50.311    Z17.0       SUMMARY OF ONCOLOGIC HISTORY: Oncology History  Breast cancer of lower-inner quadrant of right female breast ([place])  01/16/2016 Initial Biopsy   Skin biopsy done by Dr. Denna Haggard found metastatic carcinoma ER/PR +.   02/02/2016 Initial Diagnosis   Breast cancer of lower-inner quadrant of right female breast ([place])   02/02/2016 - 04/10/2016 Neo-Adjuvant Anti-estrogen oral therapy   Anastrozole $RemoveBefo'1mg'qjbUpOAppLD$  daily.   02/10/2016 Procedure   CT chest negative for metastatic disease Bone scan negative for metastatic disease   02/14/2016 Mammogram   1.7cm irregular mass in right lower inner quadrant corresponding with known breast cancer skin biopsy.   02/16/2016 Breast MRI   1. Biopsy proven cancer in lower inner quadrant of right breast: 2.8x1.7x1cm, extending to skin surface.  No other evidence of disease in right breast, left breast, or abnormal lymph nodes within the axillary or internal mammary chain regions.   2. Outside ultrasound report recommended additional ultrasound guided biopsy for hypoechoic mass in right breast at 9 o'clock suspected to be intramammary node.     02/22/2016 Procedure   Biopsy of 9 o'clock mass: complex sclerosing lesion with atypical ductal hyperplasia.     03/20/2016 Oncotype testing   Declined oncotype testing   03/20/2016 Surgery   1. Right lumpectomy (9 o'clock lesion): atypical ductal hyperplasia 2. Right partial mastectomy: IDC, grade 1, ER+(100%), PR(15%), Ki-67 5%, HER-2 neg (ratio 1.40), 2.5 cm, with atypical ductal hyperplasia, lymphovascular invasion, margins negative.   04/05/2016 -  Radiation Therapy   Declined radiation therapy    07/17/2016 -  Anti-estrogen oral therapy   Started Tamoxifen $RemoveBeforeDEI'20mg'SeOSMtjUMQZieGfW$  daily.  A total of 5 years of therapy planned.   10/23/2020 Relapse/Recurrence   Patient has persistent right hip pain. MRI on 10/23/20 after continued pain showed non mass-like hypointensity involving the right femoral head, neck, and intertrochanteric region possibly representing metastasis with superimposed pathologic stress fracture. Bone scan on 11/04/20 showed suspected lesions involving the lateral right third rib and the posterior right fourth rib.    11/22/2020 Surgery   Right hip replacement Ninfa Linden): ER+ metastatic carcinoma consistent with breast origin.    12/30/2020 -  Radiation Therapy   Palliative radiation to right femoral head      CHIEF COMPLIANT: Follow-up of metastatic breast cancer   INTERVAL HISTORY: Jessica [name] is a 86 y.o. with above-mentioned history of metastatic breast cancer currently on treatment with palliative radiation and letrozole. She presents to the clinic today for follow-up.  She is doing quite well from the metastatic breast cancer standpoint.  She is also tolerating letrozole extremely well.  She is able to walk without assistance.  She had recent scans and is here today to discuss the results.  ALLERGIES:  is allergic to codeine.  MEDICATIONS:  Current Outpatient Medications  Medication Sig Dispense Refill   ibuprofen (ADVIL,MOTRIN) 200 MG tablet Take 400 mg by mouth every 6 (six) hours as needed for moderate pain.     letrozole (FEMARA) 2.5 MG tablet Take 1 tablet (2.5 mg total) by mouth daily. 90 tablet 3   No current facility-administered medications for this visit.   Facility-Administered Medications Ordered in Other  Visits  Medication Dose Route Frequency Provider Last Rate Last Admin   denosumab (XGEVA) injection 120 mg  120 mg Subcutaneous Once Nicholas Lose, MD        PHYSICAL EXAMINATION: ECOG PERFORMANCE STATUS: 1 - Symptomatic but completely ambulatory  Vitals:    09/11/21 0920  BP: (!) 159/79  Pulse: 81  Resp: 18  Temp: 97.8 F (36.6 C)  SpO2: 93%   Filed Weights   09/11/21 0920  Weight: 154 lb 3.2 oz (69.9 kg)     LABORATORY DATA:  I have reviewed the data as listed CMP Latest Ref Rng & Units 09/06/2021 11/23/2020 11/18/2020  Glucose 70 - 99 mg/dL 113(H) 106(H) 98  BUN 8 - 23 mg/dL $Remove'13 14 16  'VbHrAUP$ Creatinine 0.44 - 1.00 mg/dL 0.82 0.73 1.00  Sodium 135 - 145 mmol/L 141 139 143  Potassium 3.5 - 5.1 mmol/L 4.2 4.0 4.7  Chloride 98 - 111 mmol/L 105 103 106  CO2 22 - 32 mmol/L $RemoveB'27 29 29  'qczyMPPW$ Calcium 8.9 - 10.3 mg/dL 9.3 8.1(L) 9.2  Total Protein 6.5 - 8.1 g/dL 7.9 - 7.0  Total Bilirubin 0.3 - 1.2 mg/dL 0.4 - 0.6  Alkaline Phos 38 - 126 U/L 110 - 80  AST 15 - 41 U/L 16 - 16  ALT 0 - 44 U/L 15 - 16    Lab Results  Component Value Date   WBC 5.7 09/06/2021   HGB 14.7 09/06/2021   HCT 45.3 09/06/2021   MCV 88.3 09/06/2021   PLT 226 09/06/2021   NEUTROABS 4.4 09/06/2021    ASSESSMENT & PLAN:  Breast cancer of lower-inner quadrant of right female breast [facility]) Metastatic breast cancer MRI right hip 10/23/2020: Stress fracture and suspected early avascular necrosis versus metastatic cancer.  Patchy marrow edema in the right femoral head neck and intertrochanteric region.   Bone scan 11/04/2020: Suspected metastatic lesions involving lateral right third rib and posterior right fourth rib.  Increased tracer uptake right femoral head and neck and intertrochanteric region corresponding to stress fracture and early AVN   11/18/20: CT CAP: Mildly enl Rt Axill LN 1.2 cm, Mixed lytic and sclerotic lesion Rt femoral head, Rt 3rd rib, Rt T4 transverse process, COPD   Rt Fem Head resection: Met breast cancer ER Pos, PR Pos, Her 2 Neg -------------------------------------------------------------------------------------------------------------------------------------------- Current treatment:  letrozole (Ibrance will be started if the scans show progression) Bone  metastases: Recommended giving Xgeva injection along with calcium and vitamin D.  We will set her up when she comes back to see [place] in 3 months.   Bone scan 03/05/2021: Stable foci of abnormal uptake in the upper thoracic spine and right lateral third rib. New focus of uptake in the right inferior pubic ramus.   09/08/2021: CT CAP and bone scan: Unchanged bone metastases T4, right third rib, decrease in right axillary lymph node   Considering stable findings, she will continue with the same treatment with letrozole. Return to clinic in 3 months with labs and follow-up for Xgeva   No orders of the defined types were placed in this encounter.  The patient has a good understanding of the overall plan. she agrees with it. she will call with any problems that may develop before the next visit here.  Total time spent: 20 mins including face to face time and time spent for planning, charting and coordination of care  Rulon Eisenmenger, MD, MPH 09/11/2021  I, Thana Ates, am acting as scribe for Dr. Nicholas Lose.  I  have reviewed the above documentation for accuracy and completeness, and I agree with the above.

## 2021-09-11 ENCOUNTER — Inpatient Hospital Stay: Payer: Medicare Other

## 2021-09-11 ENCOUNTER — Other Ambulatory Visit: Payer: Self-pay

## 2021-09-11 ENCOUNTER — Inpatient Hospital Stay: Payer: Medicare Other | Admitting: Hematology and Oncology

## 2021-09-11 DIAGNOSIS — C7951 Secondary malignant neoplasm of bone: Secondary | ICD-10-CM

## 2021-09-11 DIAGNOSIS — C50311 Malignant neoplasm of lower-inner quadrant of right female breast: Secondary | ICD-10-CM

## 2021-09-11 DIAGNOSIS — Z170 Estrogen receptor positive status [ER+]: Secondary | ICD-10-CM | POA: Diagnosis not present

## 2021-09-11 MED ORDER — DENOSUMAB 120 MG/1.7ML ~~LOC~~ SOLN
120.0000 mg | Freq: Once | SUBCUTANEOUS | Status: AC
  Administered 2021-09-11: 10:00:00 120 mg via SUBCUTANEOUS
  Filled 2021-09-11: qty 1.7

## 2021-09-11 NOTE — Assessment & Plan Note (Addendum)
Metastatic breast cancer MRI right hip 10/23/2020: Stress fracture and suspected early avascular necrosis versus metastatic cancer. Patchy marrow edema in the right femoral head neck and intertrochanteric region.  Bone scan 11/04/2020: Suspected metastatic lesions involving lateral right third rib and posterior right fourth rib. Increased tracer uptake right femoral head and neck and intertrochanteric region corresponding to stress fracture and early AVN  11/18/20: CT CAP: Mildly enl Rt Axill LN 1.2 cm, Mixed lytic and sclerotic lesion Rt femoral head, Rt 3rd rib, Rt T4 transverse process, COPD  Rt Fem Head resection: Met breast cancer ER Pos, PR Pos, Her 2 Neg -------------------------------------------------------------------------------------------------------------------------------------------- Current treatment:letrozole (Ibrance will be started if the scans show progression) Bone metastases: Recommended giving Xgeva injection along with calcium and vitamin D. We will set her up when she comes back to see [place] in 3 months.  Bone scan 03/05/2021: Stable foci of abnormal uptake in the upper thoracic spine and right lateral third rib. New focus of uptake in the right inferior pubic ramus.   09/08/2021: CT CAP and bone scan: Unchanged bone metastases T4, right third rib, decrease in right axillary lymph node  Considering stable findings, she will continue with the same treatment with letrozole. Return to clinic in96monthwithlabsand follow-up

## 2021-12-06 ENCOUNTER — Other Ambulatory Visit: Payer: Self-pay | Admitting: Hematology and Oncology

## 2021-12-11 ENCOUNTER — Other Ambulatory Visit: Payer: Self-pay | Admitting: *Deleted

## 2021-12-11 DIAGNOSIS — C7951 Secondary malignant neoplasm of bone: Secondary | ICD-10-CM

## 2021-12-11 NOTE — Progress Notes (Signed)
Patient Care Team: System, Provider Not In as PCP - General Magrinat, [name], MD (Inactive) as Consulting Physician (Oncology) [name], MD as Consulting Physician (Dermatology)  DIAGNOSIS:    ICD-10-CM   1. Malignant neoplasm of lower-inner quadrant of right breast of female, estrogen receptor positive ([name])  C50.311    Z17.0       SUMMARY OF ONCOLOGIC HISTORY: Oncology History  Breast cancer of lower-inner quadrant of right female breast ([name])  01/16/2016 Initial Biopsy   Skin biopsy done by Dr. Denna Haggard found metastatic carcinoma ER/PR +.   02/02/2016 Initial Diagnosis   Breast cancer of lower-inner quadrant of right female breast ([name])   02/02/2016 - 04/10/2016 Neo-Adjuvant Anti-estrogen oral therapy   Anastrozole 66m daily.   02/10/2016 Procedure   CT chest negative for metastatic disease Bone scan negative for metastatic disease   02/14/2016 Mammogram   1.7cm irregular mass in right lower inner quadrant corresponding with known breast cancer skin biopsy.   02/16/2016 Breast MRI   1. Biopsy proven cancer in lower inner quadrant of right breast: 2.8x1.7x1cm, extending to skin surface.  No other evidence of disease in right breast, left breast, or abnormal lymph nodes within the axillary or internal mammary chain regions.   2. Outside ultrasound report recommended additional ultrasound guided biopsy for hypoechoic mass in right breast at 9 o'clock suspected to be intramammary node.     02/22/2016 Procedure   Biopsy of 9 o'clock mass: complex sclerosing lesion with atypical ductal hyperplasia.     03/20/2016 Oncotype testing   Declined oncotype testing   03/20/2016 Surgery   1. Right lumpectomy (9 o'clock lesion): atypical ductal hyperplasia 2. Right partial mastectomy: IDC, grade 1, ER+(100%), PR(15%), Ki-67 5%, HER-2 neg (ratio 1.40), 2.5 cm, with atypical ductal hyperplasia, lymphovascular invasion, margins negative.   04/05/2016 -  Radiation Therapy   Declined radiation  therapy   07/17/2016 -  Anti-estrogen oral therapy   Started Tamoxifen 29mdaily.  A total of 5 years of therapy planned.   10/23/2020 Relapse/Recurrence   Patient has persistent right hip pain. MRI on 10/23/20 after continued pain showed non mass-like hypointensity involving the right femoral head, neck, and intertrochanteric region possibly representing metastasis with superimposed pathologic stress fracture. Bone scan on 11/04/20 showed suspected lesions involving the lateral right third rib and the posterior right fourth rib.    11/22/2020 Surgery   Right hip replacement (BNinfa Linden ER+ metastatic carcinoma consistent with breast origin.    12/30/2020 -  Radiation Therapy   Palliative radiation to right femoral head      CHIEF COMPLIANT: Follow-up of metastatic breast cancer   INTERVAL HISTORY: Jessica [name] a [age].o. with above-mentioned history of metastatic breast cancer currently on treatment with palliative radiation and letrozole. She presents to the clinic today for follow-up.  Overall she is tolerating letrozole extremely well without any problems or concerns but denies any hot flashes or arthralgias or myalgias.  She gets occasional sharp muscular pains intermittently.  She also complains of Xgeva related joint pain.  However she has been raking leaves and may be other reasons for the joint pains.  ALLERGIES:  is allergic to codeine.  MEDICATIONS:  Current Outpatient Medications  Medication Sig Dispense Refill   ibuprofen (ADVIL,MOTRIN) 200 MG tablet Take 400 mg by mouth every 6 (six) hours as needed for moderate pain.     letrozole (FEMARA) 2.5 MG tablet TAKE 1 TABLET BY MOUTH DAILY. 90 tablet 3   No current  facility-administered medications for this visit.    PHYSICAL EXAMINATION: ECOG PERFORMANCE STATUS: 1 - Symptomatic but completely ambulatory  Vitals:   12/12/21 1045  BP: (!) 144/83  Pulse: 80  Resp: 16  Temp: (!) 97.5 F (36.4 C)  SpO2: 95%   Filed Weights    12/12/21 1045  Weight: 155 lb 1.6 oz (70.4 kg)      LABORATORY DATA:  I have reviewed the data as listed CMP Latest Ref Rng & Units 09/06/2021 11/23/2020 11/18/2020  Glucose 70 - 99 mg/dL 113(H) 106(H) 98  BUN 8 - 23 mg/dL _0 Creatinine 0.44 - 1.00 mg/dL 0.82 0.73 1.00  Sodium 135 - 145 mmol/L 141 139 143  Potassium 3.5 - 5.1 mmol/L 4.2 4.0 4.7  Chloride 98 - 111 mmol/L 105 103 106  CO2 22 - 32 mmol/L _1 Calcium 8.9 - 10.3 mg/dL 9.3 8.1(L) 9.2  Total Protein 6.5 - 8.1 g/dL 7.9 - 7.0  Total Bilirubin 0.3 - 1.2 mg/dL 0.4 - 0.6  Alkaline Phos 38 - 126 U/L 110 - 80  AST 15 - 41 U/L 16 - 16  ALT 0 - 44 U/L 15 - 16    Lab Results  Component Value Date   WBC 6.9 12/12/2021   HGB 14.1 12/12/2021   HCT 43.6 12/12/2021   MCV 88.6 12/12/2021   PLT 216 12/12/2021   NEUTROABS 4.9 12/12/2021    ASSESSMENT & PLAN:  Breast cancer of lower-inner quadrant of right female breast ([place]) Metastatic breast cancer MRI right hip 10/23/2020: Stress fracture and suspected early avascular necrosis versus metastatic cancer.  Patchy marrow edema in the right femoral head neck and intertrochanteric region.   Bone scan 11/04/2020: Suspected metastatic lesions involving lateral right third rib and posterior right fourth rib.  Increased tracer uptake right femoral head and neck and intertrochanteric region corresponding to stress fracture and early AVN   11/18/20: CT CAP: Mildly enl Rt Axill LN 1.2 cm, Mixed lytic and sclerotic lesion Rt femoral head, Rt 3rd rib, Rt T4 transverse process, COPD   Rt Fem Head resection: Met breast cancer ER Pos, PR Pos, Her 2 Neg -------------------------------------------------------------------------------------------------------------------------------------------- Current treatment:  letrozole (Ibrance will be started if the scans show progression) Bone metastases: Recommended giving Xgeva injection along with calcium and vitamin D.  We will set her up when she  comes back to see [place] in 3 months.   Bone scan 03/05/2021: Stable foci of abnormal uptake in the upper thoracic spine and right lateral third rib. New focus of uptake in the right inferior pubic ramus.   09/08/2021: CT CAP and bone scan: Unchanged bone metastases T4, right third rib, decrease in right axillary lymph node  We discussed the role of CT scans every 6 months but the patient would like to postpone it if possible.  So we will not do another scan in 3 months and we will consider doing a scan at 6 months from now.  Xgeva related bone pain: She will pay very close attention to her symptoms this time.  She thinks that [place] caused her some achiness in her knee that lasted for a few days. After 2 years of every 11-monthXgeva we may consider doing every 6 months after that.   Return to clinic in 3 months with labs and follow-up along with Xgeva injection   No orders of the defined types were placed in this encounter.  The patient has a good understanding of the overall  plan. she agrees with it. she will call with any problems that may develop before the next visit here.  Total time spent: 30 mins including face to face time and time spent for planning, charting and coordination of care  Rulon Eisenmenger, MD, MPH 12/12/2021  I, Thana Ates, am acting as scribe for Dr. Nicholas Lose.  I have reviewed the above documentation for accuracy and completeness, and I agree with the above.

## 2021-12-12 ENCOUNTER — Inpatient Hospital Stay: Payer: Medicare Other | Admitting: Hematology and Oncology

## 2021-12-12 ENCOUNTER — Other Ambulatory Visit: Payer: Self-pay

## 2021-12-12 ENCOUNTER — Inpatient Hospital Stay: Payer: Medicare Other

## 2021-12-12 ENCOUNTER — Inpatient Hospital Stay: Payer: Medicare Other | Attending: Hematology and Oncology

## 2021-12-12 DIAGNOSIS — Z170 Estrogen receptor positive status [ER+]: Secondary | ICD-10-CM | POA: Insufficient documentation

## 2021-12-12 DIAGNOSIS — C7951 Secondary malignant neoplasm of bone: Secondary | ICD-10-CM | POA: Diagnosis not present

## 2021-12-12 DIAGNOSIS — C50311 Malignant neoplasm of lower-inner quadrant of right female breast: Secondary | ICD-10-CM | POA: Insufficient documentation

## 2021-12-12 LAB — CMP (CANCER CENTER ONLY)
ALT: 13 U/L (ref 0–44)
AST: 13 U/L — ABNORMAL LOW (ref 15–41)
Albumin: 3.9 g/dL (ref 3.5–5.0)
Alkaline Phosphatase: 65 U/L (ref 38–126)
Anion gap: 4 — ABNORMAL LOW (ref 5–15)
BUN: 19 mg/dL (ref 8–23)
CO2: 31 mmol/L (ref 22–32)
Calcium: 8.9 mg/dL (ref 8.9–10.3)
Chloride: 106 mmol/L (ref 98–111)
Creatinine: 0.88 mg/dL (ref 0.44–1.00)
GFR, Estimated: >60 mL/min (ref >60)
Glucose, Bld: 90 mg/dL (ref 70–99)
Potassium: 4.3 mmol/L (ref 3.5–5.1)
Sodium: 141 mmol/L (ref 135–145)
Total Bilirubin: 0.4 mg/dL (ref 0.3–1.2)
Total Protein: 7.3 g/dL (ref 6.5–8.1)

## 2021-12-12 LAB — CBC WITH DIFFERENTIAL (CANCER CENTER ONLY)
Abs Immature Granulocytes: 0.02 10*3/uL (ref 0.00–0.07)
Basophils Absolute: 0.1 10*3/uL (ref 0.0–0.1)
Basophils Relative: 1 %
Eosinophils Absolute: 0.1 10*3/uL (ref 0.0–0.5)
Eosinophils Relative: 1 %
HCT: 43.6 % (ref 36.0–46.0)
Hemoglobin: 14.1 g/dL (ref 12.0–15.0)
Immature Granulocytes: 0 %
Lymphocytes Relative: 17 %
Lymphs Abs: 1.2 10*3/uL (ref 0.7–4.0)
MCH: 28.7 pg (ref 26.0–34.0)
MCHC: 32.3 g/dL (ref 30.0–36.0)
MCV: 88.6 fL (ref 80.0–100.0)
Monocytes Absolute: 0.6 10*3/uL (ref 0.1–1.0)
Monocytes Relative: 9 %
Neutro Abs: 4.9 10*3/uL (ref 1.7–7.7)
Neutrophils Relative %: 72 %
Platelet Count: 216 10*3/uL (ref 150–400)
RBC: 4.92 MIL/uL (ref 3.87–5.11)
RDW: 13.2 % (ref 11.5–15.5)
WBC Count: 6.9 10*3/uL (ref 4.0–10.5)
nRBC: 0 % (ref 0.0–0.2)

## 2021-12-12 MED ORDER — DENOSUMAB 120 MG/1.7ML ~~LOC~~ SOLN
120.0000 mg | Freq: Once | SUBCUTANEOUS | Status: AC
  Administered 2021-12-12: 120 mg via SUBCUTANEOUS
  Filled 2021-12-12: qty 1.7

## 2021-12-12 NOTE — Assessment & Plan Note (Addendum)
Metastatic breast cancer MRI right hip 10/23/2020: Stress fracture and suspected early avascular necrosis versus metastatic cancer. Patchy marrow edema in the right femoral head neck and intertrochanteric region.  Bone scan 11/04/2020: Suspected metastatic lesions involving lateral right third rib and posterior right fourth rib. Increased tracer uptake right femoral head and neck and intertrochanteric region corresponding to stress fracture and early AVN  11/18/20: CT CAP: Mildly enl Rt Axill LN 1.2 cm, Mixed lytic and sclerotic lesion Rt femoral head, Rt 3rd rib, Rt T4 transverse process, COPD  Rt Fem Head resection: Met breast cancer ER Pos, PR Pos, Her 2 Neg -------------------------------------------------------------------------------------------------------------------------------------------- Current treatment:letrozole (Ibrance will be started if the scans show progression) Bone metastases: Recommended giving Xgeva injection along with calcium and vitamin D. We will set her up when she comes back to see [place] in27month.  Bone scan 03/05/2021: Stable foci of abnormal uptake in the upper thoracic spine and right lateral third rib. New focus of uptake in the right inferior pubic ramus.   09/08/2021: CT CAP and bone scan: Unchanged bone metastases T4, right third rib, decrease in right axillary lymph node  Considering stable findings, she will continue with the same treatment with letrozole. Return to clinic in361monthithlabs, scansand [facility]

## 2022-01-14 ENCOUNTER — Encounter: Payer: Self-pay | Admitting: Hematology and Oncology

## 2022-02-26 NOTE — Progress Notes (Signed)
Patient Care Team: System, Provider Not In as PCP - General Jessica [name], Jessica Dad, MD (Inactive) as Consulting Physician (Oncology) Jessica Monarch, MD as Consulting Physician (Dermatology)  DIAGNOSIS:  Encounter Diagnoses  Name Primary?   Malignant neoplasm of lower-inner quadrant of right breast of female, estrogen receptor positive ([place])    Malignant neoplasm metastatic to bone ([place]) Yes    SUMMARY OF ONCOLOGIC HISTORY: Oncology History  Breast cancer of lower-inner quadrant of right female breast (Jessica [name])  01/16/2016 Initial Biopsy   Skin biopsy done by Dr. Denna [name] found metastatic carcinoma ER/PR +.    02/02/2016 Initial Diagnosis   Breast cancer of lower-inner quadrant of right female breast (Jessica [name])    02/02/2016 - 04/10/2016 Neo-Adjuvant Anti-estrogen oral therapy   Anastrozole 40m daily.    02/10/2016 Procedure   CT chest negative for metastatic disease Bone scan negative for metastatic disease    02/14/2016 Mammogram   1.7cm irregular mass in right lower inner quadrant corresponding with known breast cancer skin biopsy.    02/16/2016 Breast MRI   1. Biopsy proven cancer in lower inner quadrant of right breast: 2.8x1.7x1cm, extending to skin surface.  No other evidence of disease in right breast, left breast, or abnormal lymph nodes within the axillary or internal mammary chain regions.   2. Outside ultrasound report recommended additional ultrasound guided biopsy for hypoechoic mass in right breast at 9 o'clock suspected to be intramammary node.      02/22/2016 Procedure   Biopsy of 9 o'clock mass: complex sclerosing lesion with atypical ductal hyperplasia.      03/20/2016 Oncotype testing   Declined oncotype testing    03/20/2016 Surgery   1. Right lumpectomy (9 o'clock lesion): atypical ductal hyperplasia 2. Right partial mastectomy: IDC, grade 1, ER+(100%), PR(15%), Ki-67 5%, HER-2 neg (ratio 1.40), 2.5 cm, with atypical ductal hyperplasia, lymphovascular invasion, margins  negative.    04/05/2016 -  Radiation Therapy   Declined radiation therapy    07/17/2016 -  Anti-estrogen oral therapy   Started Tamoxifen 239mdaily.  A total of 5 years of therapy planned.    10/23/2020 Relapse/Recurrence   Patient has persistent right hip pain. MRI on 10/23/20 after continued pain showed non mass-like hypointensity involving the right femoral head, neck, and intertrochanteric region possibly representing metastasis with superimposed pathologic stress fracture. Bone scan on 11/04/20 showed suspected lesions involving the lateral right third rib and the posterior right fourth rib.    11/22/2020 Surgery   Right hip replacement (BNinfa [name] ER+ metastatic carcinoma consistent with breast origin.    12/30/2020 -  Radiation Therapy   Palliative radiation to right femoral head      CHIEF COMPLIANT:  Follow-up of metastatic breast cancer on Letrozole  INTERVAL HISTORY: BeMARJIE [name] a [age].o. with above-mentioned history of metastatic breast cancer currently on treatment with palliative radiation and letrozole. She presents to the clinic today for follow-up. She states after the injection she had shoulder pain and joint pain. States that its hard to sleep and can barley raise her arm. Complain of pain in bones and knees her legs felt so heavy. Denies hot flashes from taking the letrozole.   ALLERGIES:  is allergic to codeine.  MEDICATIONS:  Current Outpatient Medications  Medication Sig Dispense Refill   ibuprofen (ADVIL,MOTRIN) 200 MG tablet Take 400 mg by mouth every 6 (six) hours as needed for moderate pain.     letrozole (FEMARA) 2.5 MG tablet TAKE 1 TABLET BY MOUTH DAILY. 90 tablet 3  No current facility-administered medications for this visit.    PHYSICAL EXAMINATION: ECOG PERFORMANCE STATUS: 1 - Symptomatic but completely ambulatory  Vitals:   03/06/22 1028  BP: 129/79  Pulse: 74  Resp: 18  Temp: 98.1 F (36.7 C)  SpO2: 93%   Filed Weights   03/06/22  1028  Weight: 154 lb 9.6 oz (70.1 kg)      LABORATORY DATA:  I have reviewed the data as listed    Latest Ref Rng & Units 03/06/2022   10:01 AM 12/12/2021   10:31 AM 09/06/2021    9:13 AM  CMP  Glucose 70 - 99 mg/dL 104   90   113    BUN 8 - 23 mg/dL _0 Creatinine 0.44 - 1.00 mg/dL 0.94   0.88   0.82    Sodium 135 - 145 mmol/L 139   141   141    Potassium 3.5 - 5.1 mmol/L 5.1   4.3   4.2    Chloride 98 - 111 mmol/L 102   106   105    CO2 22 - 32 mmol/L 33   31   27    Calcium 8.9 - 10.3 mg/dL 9.2   8.9   9.3    Total Protein 6.5 - 8.1 g/dL 7.6   7.3   7.9    Total Bilirubin 0.3 - 1.2 mg/dL 0.4   0.4   0.4    Alkaline Phos 38 - 126 U/L 60   65   110    AST 15 - 41 U/L _1 ALT 0 - 44 U/L _2 Lab Results  Component Value Date   WBC 7.4 03/06/2022   HGB 14.4 03/06/2022   HCT 43.5 03/06/2022   MCV 88.1 03/06/2022   PLT 218 03/06/2022   NEUTROABS 5.4 03/06/2022    ASSESSMENT & PLAN:  Breast cancer of lower-inner quadrant of right female breast [facility]) Metastatic breast cancer MRI right hip 10/23/2020: Stress fracture and suspected early avascular necrosis versus metastatic cancer.  Patchy marrow edema in the right femoral head neck and intertrochanteric region.   Bone scan 11/04/2020: Suspected metastatic lesions involving lateral right third rib and posterior right fourth rib.  Increased tracer uptake right femoral head and neck and intertrochanteric region corresponding to stress fracture and early AVN   11/18/20: CT CAP: Mildly enl Rt Axill LN 1.2 cm, Mixed lytic and sclerotic lesion Rt femoral head, Rt 3rd rib, Rt T4 transverse process, COPD   Rt Fem Head resection: Met breast cancer ER Pos, PR Pos, Her 2 Neg -------------------------------------------------------------------------------------------------------------------------------------------- Current treatment:  letrozole (Ibrance will be started if the scans show progression) Bone  metastases: Xgeva injection along with calcium and vitamin D started 09/11/2021.   Bone scan 03/05/2021: Stable foci of abnormal uptake in the upper thoracic spine and right lateral third rib. New focus of uptake in the right inferior pubic ramus.   09/08/2021: CT CAP and bone scan: Unchanged bone metastases T4, right third rib, decrease in right axillary lymph node   CT CAP in 3 months and follow-up after that   Xgeva related bone pain: She had the bone pain for several days and it lasted even up to 2 weeks.  Therefore she does not want to proceed with any further and we will discontinue that at this time.  Return  to clinic in 3 months with labs, scans and follow-up    Orders Placed This Encounter  Procedures   CT CHEST ABDOMEN PELVIS W CONTRAST    Standing Status:   Future    Standing Expiration Date:   03/07/2023    Order Specific Question:   Preferred imaging location?    Answer:   [facility]    Order Specific Question:   Release to patient    Answer:   Immediate    Order Specific Question:   Is Oral Contrast requested for this exam?    Answer:   Yes, Per Radiology protocol   The patient has a good understanding of the overall plan. she agrees with it. she will call with any problems that may develop before the next visit here. Total time spent: 30 mins including face to face time and time spent for planning, charting and co-ordination of care   Harriette Ohara, MD 03/06/22    I Gardiner Coins am scribing for Dr. Lindi Adie  I have reviewed the above documentation for accuracy and completeness, and I agree with the above.

## 2022-03-06 ENCOUNTER — Inpatient Hospital Stay: Payer: Medicare Other

## 2022-03-06 ENCOUNTER — Inpatient Hospital Stay: Payer: Medicare Other | Admitting: Hematology and Oncology

## 2022-03-06 ENCOUNTER — Inpatient Hospital Stay: Payer: Medicare Other | Attending: Hematology and Oncology

## 2022-03-06 ENCOUNTER — Other Ambulatory Visit: Payer: Self-pay

## 2022-03-06 VITALS — BP 129/79 | HR 74 | Temp 98.1°F | Resp 18 | Ht 62.0 in | Wt 154.6 lb

## 2022-03-06 DIAGNOSIS — C7951 Secondary malignant neoplasm of bone: Secondary | ICD-10-CM

## 2022-03-06 DIAGNOSIS — Z170 Estrogen receptor positive status [ER+]: Secondary | ICD-10-CM

## 2022-03-06 DIAGNOSIS — C50311 Malignant neoplasm of lower-inner quadrant of right female breast: Secondary | ICD-10-CM | POA: Diagnosis not present

## 2022-03-06 LAB — CBC WITH DIFFERENTIAL (CANCER CENTER ONLY)
Abs Immature Granulocytes: 0.02 10*3/uL (ref 0.00–0.07)
Basophils Absolute: 0.1 10*3/uL (ref 0.0–0.1)
Basophils Relative: 1 %
Eosinophils Absolute: 0.1 10*3/uL (ref 0.0–0.5)
Eosinophils Relative: 2 %
HCT: 43.5 % (ref 36.0–46.0)
Hemoglobin: 14.4 g/dL (ref 12.0–15.0)
Immature Granulocytes: 0 %
Lymphocytes Relative: 17 %
Lymphs Abs: 1.2 10*3/uL (ref 0.7–4.0)
MCH: 29.1 pg (ref 26.0–34.0)
MCHC: 33.1 g/dL (ref 30.0–36.0)
MCV: 88.1 fL (ref 80.0–100.0)
Monocytes Absolute: 0.6 10*3/uL (ref 0.1–1.0)
Monocytes Relative: 8 %
Neutro Abs: 5.4 10*3/uL (ref 1.7–7.7)
Neutrophils Relative %: 72 %
Platelet Count: 218 10*3/uL (ref 150–400)
RBC: 4.94 MIL/uL (ref 3.87–5.11)
RDW: 13.1 % (ref 11.5–15.5)
WBC Count: 7.4 10*3/uL (ref 4.0–10.5)
nRBC: 0 % (ref 0.0–0.2)

## 2022-03-06 LAB — CMP (CANCER CENTER ONLY)
ALT: 11 U/L (ref 0–44)
AST: 13 U/L — ABNORMAL LOW (ref 15–41)
Albumin: 3.9 g/dL (ref 3.5–5.0)
Alkaline Phosphatase: 60 U/L (ref 38–126)
Anion gap: 4 — ABNORMAL LOW (ref 5–15)
BUN: 23 mg/dL (ref 8–23)
CO2: 33 mmol/L — ABNORMAL HIGH (ref 22–32)
Calcium: 9.2 mg/dL (ref 8.9–10.3)
Chloride: 102 mmol/L (ref 98–111)
Creatinine: 0.94 mg/dL (ref 0.44–1.00)
GFR, Estimated: 59 mL/min — ABNORMAL LOW (ref >60)
Glucose, Bld: 104 mg/dL — ABNORMAL HIGH (ref 70–99)
Potassium: 5.1 mmol/L (ref 3.5–5.1)
Sodium: 139 mmol/L (ref 135–145)
Total Bilirubin: 0.4 mg/dL (ref 0.3–1.2)
Total Protein: 7.6 g/dL (ref 6.5–8.1)

## 2022-03-06 NOTE — Progress Notes (Signed)
Per MD, pt will not receive inj today. Inj appt cancelled.

## 2022-03-06 NOTE — Assessment & Plan Note (Signed)
Metastatic breast cancer MRI right hip 10/23/2020: Stress fracture and suspected early avascular necrosis versus metastatic cancer. Patchy marrow edema in the right femoral head neck and intertrochanteric region.  Bone scan 11/04/2020: Suspected metastatic lesions involving lateral right third rib and posterior right fourth rib. Increased tracer uptake right femoral head and neck and intertrochanteric region corresponding to stress fracture and early AVN  11/18/20: CT CAP: Mildly enl Rt Axill LN 1.2 cm, Mixed lytic and sclerotic lesion Rt femoral head, Rt 3rd rib, Rt T4 transverse process, COPD  Rt Fem Head resection: Met breast cancer ER Pos, PR Pos, Her 2 Neg -------------------------------------------------------------------------------------------------------------------------------------------- Current treatment:letrozole (Ibrance will be started if the scansshow progression) Bone metastases: Xgeva injection along with calcium and vitamin D started 09/11/2021.  Bone scan 03/05/2021: Stable foci of abnormal uptake in the upper thoracic spine and right lateral third rib. New focus of uptake in the right inferior pubic ramus.  09/08/2021: CT CAP and bone scan: Unchanged bone metastases T4, right third rib, decrease in right axillary lymph node  CT CAP in 3 months and follow-up after that  Xgeva related bone pain: She will pay very close attention to her symptoms this time.  She thinks that [place] caused her some achiness in her knee that lasted for a few days. After 2 years of every 31-monthXgeva we may consider doing every 6 months after that.   Return to clinic in 3 months with labs and follow-up along with Xgeva injection and scans and follow-up

## 2022-05-18 ENCOUNTER — Telehealth: Payer: Self-pay | Admitting: Hematology and Oncology

## 2022-05-18 NOTE — Telephone Encounter (Signed)
Rescheduled appointment per room/resource. Talked with the patients son and he is aware of the changes made to the patients upcoming appointment.

## 2022-06-05 ENCOUNTER — Encounter (HOSPITAL_COMMUNITY): Payer: Self-pay

## 2022-06-05 ENCOUNTER — Ambulatory Visit (HOSPITAL_COMMUNITY)
Admission: RE | Admit: 2022-06-05 | Discharge: 2022-06-05 | Disposition: A | Discharge status: Home / Self Care | Payer: Medicare Other | Source: Ambulatory Visit | Attending: Hematology and Oncology | Admitting: Hematology and Oncology

## 2022-06-05 DIAGNOSIS — Z853 Personal history of malignant neoplasm of breast: Secondary | ICD-10-CM | POA: Diagnosis not present

## 2022-06-05 DIAGNOSIS — C7951 Secondary malignant neoplasm of bone: Secondary | ICD-10-CM | POA: Diagnosis not present

## 2022-06-05 DIAGNOSIS — Z170 Estrogen receptor positive status [ER+]: Secondary | ICD-10-CM | POA: Insufficient documentation

## 2022-06-05 DIAGNOSIS — C50311 Malignant neoplasm of lower-inner quadrant of right female breast: Secondary | ICD-10-CM | POA: Diagnosis not present

## 2022-06-05 DIAGNOSIS — J432 Centrilobular emphysema: Secondary | ICD-10-CM | POA: Diagnosis not present

## 2022-06-05 DIAGNOSIS — I700 Atherosclerosis of aorta: Secondary | ICD-10-CM | POA: Diagnosis not present

## 2022-06-05 MED ORDER — IOHEXOL 300 MG/ML  SOLN
100.0000 mL | Freq: Once | INTRAMUSCULAR | Status: AC | PRN
  Administered 2022-06-05: 100 mL via INTRAVENOUS

## 2022-06-05 MED ORDER — SODIUM CHLORIDE (PF) 0.9 % IJ SOLN
INTRAMUSCULAR | Status: AC
  Filled 2022-06-05: qty 50

## 2022-06-05 NOTE — Progress Notes (Signed)
Patient Care Team: System, Provider Not In as PCP - General Magrinat, [name], MD (Inactive) as Consulting Physician (Oncology) [name], MD as Consulting Physician (Dermatology)  DIAGNOSIS:  Encounter Diagnosis  Name Primary?   Malignant neoplasm of lower-inner quadrant of right breast of female, estrogen receptor positive ([place])     SUMMARY OF ONCOLOGIC HISTORY: Oncology History  Breast cancer of lower-inner quadrant of right female breast ([place])  01/16/2016 Initial Biopsy   Skin biopsy done by Dr. Denna Haggard found metastatic carcinoma ER/PR +.   02/02/2016 Initial Diagnosis   Breast cancer of lower-inner quadrant of right female breast ([place])   02/02/2016 - 04/10/2016 Neo-Adjuvant Anti-estrogen oral therapy   Anastrozole 16m daily.   02/10/2016 Procedure   CT chest negative for metastatic disease Bone scan negative for metastatic disease   02/14/2016 Mammogram   1.7cm irregular mass in right lower inner quadrant corresponding with known breast cancer skin biopsy.   02/16/2016 Breast MRI   1. Biopsy proven cancer in lower inner quadrant of right breast: 2.8x1.7x1cm, extending to skin surface.  No other evidence of disease in right breast, left breast, or abnormal lymph nodes within the axillary or internal mammary chain regions.   2. Outside ultrasound report recommended additional ultrasound guided biopsy for hypoechoic mass in right breast at 9 o'clock suspected to be intramammary node.     02/22/2016 Procedure   Biopsy of 9 o'clock mass: complex sclerosing lesion with atypical ductal hyperplasia.     03/20/2016 Oncotype testing   Declined oncotype testing   03/20/2016 Surgery   1. Right lumpectomy (9 o'clock lesion): atypical ductal hyperplasia 2. Right partial mastectomy: IDC, grade 1, ER+(100%), PR(15%), Ki-67 5%, HER-2 neg (ratio 1.40), 2.5 cm, with atypical ductal hyperplasia, lymphovascular invasion, margins negative.   04/05/2016 -  Radiation Therapy   Declined radiation  therapy   07/17/2016 -  Anti-estrogen oral therapy   Started Tamoxifen 231mdaily.  A total of 5 years of therapy planned.   10/23/2020 Relapse/Recurrence   Patient has persistent right hip pain. MRI on 10/23/20 after continued pain showed non mass-like hypointensity involving the right femoral head, neck, and intertrochanteric region possibly representing metastasis with superimposed pathologic stress fracture. Bone scan on 11/04/20 showed suspected lesions involving the lateral right third rib and the posterior right fourth rib.    11/22/2020 Surgery   Right hip replacement (BNinfa Linden ER+ metastatic carcinoma consistent with breast origin.    12/30/2020 -  Radiation Therapy   Palliative radiation to right femoral head      CHIEF COMPLIANT:  Follow-up of metastatic breast cancer on Letrozole    INTERVAL HISTORY: Jessica [name] a [age].o. with above-mentioned history of metastatic breast cancer currently on treatment with palliative radiation and letrozole. She presents to the clinic today for follow-up. She is tolerating the letrozole. Denies hot flashes or any other side effects. She had stiff legs that lasted for about 2 weeks with the Xgeva. She also was hallucinating and shoulder bone pain.   ALLERGIES:  is allergic to codeine.  MEDICATIONS:  Current Outpatient Medications  Medication Sig Dispense Refill   ibuprofen (ADVIL,MOTRIN) 200 MG tablet Take 400 mg by mouth every 6 (six) hours as needed for moderate pain.     letrozole (FEMARA) 2.5 MG tablet TAKE 1 TABLET BY MOUTH DAILY. 90 tablet 3   No current facility-administered medications for this visit.    PHYSICAL EXAMINATION: ECOG PERFORMANCE STATUS: 1 - Symptomatic but completely ambulatory  Vitals:  06/12/22 1039  BP: (!) 143/83  Pulse: 87  Resp: 18  Temp: 97.8 F (36.6 C)  SpO2: 91%   Filed Weights   06/12/22 1039  Weight: 153 lb 11.2 oz (69.7 kg)     LABORATORY DATA:  I have reviewed the data as listed     Latest Ref Rng & Units 06/05/2022    8:57 AM 03/06/2022   10:01 AM 12/12/2021   10:31 AM  CMP  Glucose 70 - 99 mg/dL  104  90   BUN 8 - 23 mg/dL  23  19   Creatinine 0.44 - 1.00 mg/dL 1.00  0.94  0.88   Sodium 135 - 145 mmol/L  139  141   Potassium 3.5 - 5.1 mmol/L  5.1  4.3   Chloride 98 - 111 mmol/L  102  106   CO2 22 - 32 mmol/L  33  31   Calcium 8.9 - 10.3 mg/dL  9.2  8.9   Total Protein 6.5 - 8.1 g/dL  7.6  7.3   Total Bilirubin 0.3 - 1.2 mg/dL  0.4  0.4   Alkaline Phos 38 - 126 U/L  60  65   AST 15 - 41 U/L  13  13   ALT 0 - 44 U/L  11  13     Lab Results  Component Value Date   WBC 7.4 03/06/2022   HGB 14.4 03/06/2022   HCT 43.5 03/06/2022   MCV 88.1 03/06/2022   PLT 218 03/06/2022   NEUTROABS 5.4 03/06/2022    ASSESSMENT & PLAN:  Breast cancer of lower-inner quadrant of right female breast [facility]) Metastatic breast cancer MRI right hip 10/23/2020: Stress fracture and suspected early avascular necrosis versus metastatic cancer.  Patchy marrow edema in the right femoral head neck and intertrochanteric region.   Bone scan 11/04/2020: Suspected metastatic lesions involving lateral right third rib and posterior right fourth rib.  Increased tracer uptake right femoral head and neck and intertrochanteric region corresponding to stress fracture and early AVN   11/18/20: CT CAP: Mildly enl Rt Axill LN 1.2 cm, Mixed lytic and sclerotic lesion Rt femoral head, Rt 3rd rib, Rt T4 transverse process, COPD   Rt Fem Head resection: Met breast cancer ER Pos, PR Pos, Her 2 Neg -------------------------------------------------------------------------------------------------------------------------------------------- Current treatment:  letrozole (Ibrance will be started if the scans show progression) Bone metastases: Xgeva injection along with calcium and vitamin D started 09/11/2021.   Bone scan 03/05/2021: Stable foci of abnormal uptake in the upper thoracic spine and right lateral third rib.  New focus of uptake in the right inferior pubic ramus.   09/08/2021: CT CAP and bone scan: Unchanged bone metastases T4, right third rib, decrease in right axillary lymph node   CT CAP 06/06/2022: Stable right axillary lymph node.  No evidence of new or progressive disease in chest abdomen pelvis   Xgeva related bone pain:  Discontinued Xgeva   Return to clinic in 3 months with labs and follow-up Plan to do scans in 6 months    No orders of the defined types were placed in this encounter.  The patient has a good understanding of the overall plan. she agrees with it. she will call with any problems that may develop before the next visit here. Total time spent: 30 mins including face to face time and time spent for planning, charting and co-ordination of care   Harriette Ohara, MD 06/12/22    I Gardiner Coins am scribing for Dr.  Brigit Doke  I have reviewed the above documentation for accuracy and completeness, and I agree with the above.

## 2022-06-07 ENCOUNTER — Ambulatory Visit: Payer: Medicare Other | Admitting: Hematology and Oncology

## 2022-06-11 LAB — POCT I-STAT CREATININE: Creatinine, Ser: 1 mg/dL (ref 0.44–1.00)

## 2022-06-12 ENCOUNTER — Other Ambulatory Visit: Payer: Self-pay

## 2022-06-12 ENCOUNTER — Inpatient Hospital Stay: Payer: Medicare Other | Attending: Hematology and Oncology | Admitting: Hematology and Oncology

## 2022-06-12 DIAGNOSIS — C50311 Malignant neoplasm of lower-inner quadrant of right female breast: Secondary | ICD-10-CM | POA: Diagnosis not present

## 2022-06-12 DIAGNOSIS — Z170 Estrogen receptor positive status [ER+]: Secondary | ICD-10-CM | POA: Insufficient documentation

## 2022-06-12 DIAGNOSIS — Z79811 Long term (current) use of aromatase inhibitors: Secondary | ICD-10-CM | POA: Insufficient documentation

## 2022-06-12 DIAGNOSIS — C7951 Secondary malignant neoplasm of bone: Secondary | ICD-10-CM | POA: Diagnosis not present

## 2022-06-12 NOTE — Assessment & Plan Note (Signed)
Metastatic breast cancer MRI right hip 10/23/2020: Stress fracture and suspected early avascular necrosis versus metastatic cancer. Patchy marrow edema in the right femoral head neck and intertrochanteric region.  Bone scan 11/04/2020: Suspected metastatic lesions involving lateral right third rib and posterior right fourth rib. Increased tracer uptake right femoral head and neck and intertrochanteric region corresponding to stress fracture and early AVN  11/18/20: CT CAP: Mildly enl Rt Axill LN 1.2 cm, Mixed lytic and sclerotic lesion Rt femoral head, Rt 3rd rib, Rt T4 transverse process, COPD  Rt Fem Head resection: Met breast cancer ER Pos, PR Pos, Her 2 Neg -------------------------------------------------------------------------------------------------------------------------------------------- Current treatment:letrozole (Ibrance will be started if the scansshow progression) Bone metastases: Xgeva injection along with calcium and vitamin D started 09/11/2021.  Bone scan 03/05/2021: Stable foci of abnormal uptake in the upper thoracic spine and right lateral third rib. New focus of uptake in the right inferior pubic ramus.  09/08/2021: CT CAP and bone scan: Unchanged bone metastases T4, right third rib, decrease in right axillary lymph node  CT CAP 06/06/2022: Stable right axillary lymph node.  No evidence of new or progressive disease in chest abdomen pelvis  Xgeva related bone pain:  Discontinued Xgeva  Return to clinic in 3 months with labs and follow-up

## 2022-09-09 NOTE — Progress Notes (Signed)
Patient Care Team: System, Provider Not In as PCP - General Jessica [name], Jessica Dad, MD (Inactive) as Consulting Physician (Oncology) Jessica Monarch, MD (Inactive) as Consulting Physician (Dermatology)  DIAGNOSIS:  Encounter Diagnosis  Name Primary?   Malignant neoplasm of lower-inner quadrant of right breast of female, estrogen receptor positive ([place]) Yes    SUMMARY OF ONCOLOGIC HISTORY: Oncology History  Breast cancer of lower-inner quadrant of right female breast ([place])  01/16/2016 Initial Biopsy   Skin biopsy done by Dr. Denna [name] found metastatic carcinoma ER/PR +.   02/02/2016 Initial Diagnosis   Breast cancer of lower-inner quadrant of right female breast ([place])   02/02/2016 - 04/10/2016 Neo-Adjuvant Anti-estrogen oral therapy   Anastrozole 40m daily.   02/10/2016 Procedure   CT chest negative for metastatic disease Bone scan negative for metastatic disease   02/14/2016 Mammogram   1.7cm irregular mass in right lower inner quadrant corresponding with known breast cancer skin biopsy.   02/16/2016 Breast MRI   1. Biopsy proven cancer in lower inner quadrant of right breast: 2.8x1.7x1cm, extending to skin surface.  No other evidence of disease in right breast, left breast, or abnormal lymph nodes within the axillary or internal mammary chain regions.   2. Outside ultrasound report recommended additional ultrasound guided biopsy for hypoechoic mass in right breast at 9 o'clock suspected to be intramammary node.     02/22/2016 Procedure   Biopsy of 9 o'clock mass: complex sclerosing lesion with atypical ductal hyperplasia.     03/20/2016 Oncotype testing   Declined oncotype testing   03/20/2016 Surgery   1. Right lumpectomy (9 o'clock lesion): atypical ductal hyperplasia 2. Right partial mastectomy: IDC, grade 1, ER+(100%), PR(15%), Ki-67 5%, HER-2 neg (ratio 1.40), 2.5 cm, with atypical ductal hyperplasia, lymphovascular invasion, margins negative.   04/05/2016 -  Radiation Therapy    Declined radiation therapy   07/17/2016 -  Anti-estrogen oral therapy   Started Tamoxifen 274mdaily.  A total of 5 years of therapy planned.   10/23/2020 Relapse/Recurrence   Patient has persistent right hip pain. MRI on 10/23/20 after continued pain showed non mass-like hypointensity involving the right femoral head, neck, and intertrochanteric region possibly representing metastasis with superimposed pathologic stress fracture. Bone scan on 11/04/20 showed suspected lesions involving the lateral right third rib and the posterior right fourth rib.    11/22/2020 Surgery   Right hip replacement (BNinfa Linden ER+ metastatic carcinoma consistent with breast origin.    12/30/2020 -  Radiation Therapy   Palliative radiation to right femoral head      CHIEF COMPLIANT:  Follow-up of metastatic breast cancer on Letrozole   INTERVAL HISTORY: Jessica [name] a [age].o. with above-mentioned history of metastatic breast cancer currently on treatment with palliative radiation and letrozole. She presents to the clinic today for follow-up. She states that she has been doing good since she sttopped the XgNigerShe reports that she is tolerating the anastrozole with no symptoms or concerns.   ALLERGIES:  is allergic to codeine.  MEDICATIONS:  Current Outpatient Medications  Medication Sig Dispense Refill   ibuprofen (ADVIL,MOTRIN) 200 MG tablet Take 400 mg by mouth every 6 (six) hours as needed for moderate pain.     letrozole (FEMARA) 2.5 MG tablet TAKE 1 TABLET BY MOUTH DAILY. 90 tablet 3   No current facility-administered medications for this visit.    PHYSICAL EXAMINATION: ECOG PERFORMANCE STATUS: 1 - Symptomatic but completely ambulatory  Vitals:   09/12/22 1016  BP: (!) 153/74  Pulse:  80  Resp: 18  Temp: 97.7 F (36.5 C)  SpO2: 93%   Filed Weights   09/12/22 1016  Weight: 155 lb 6.4 oz (70.5 kg)      LABORATORY DATA:  I have reviewed the data as listed    Latest Ref Rng & Units  06/05/2022    8:57 AM 03/06/2022   10:01 AM 12/12/2021   10:31 AM  CMP  Glucose 70 - 99 mg/dL  104  90   BUN 8 - 23 mg/dL  23  19   Creatinine 0.44 - 1.00 mg/dL 1.00  0.94  0.88   Sodium 135 - 145 mmol/L  139  141   Potassium 3.5 - 5.1 mmol/L  5.1  4.3   Chloride 98 - 111 mmol/L  102  106   CO2 22 - 32 mmol/L  33  31   Calcium 8.9 - 10.3 mg/dL  9.2  8.9   Total Protein 6.5 - 8.1 g/dL  7.6  7.3   Total Bilirubin 0.3 - 1.2 mg/dL  0.4  0.4   Alkaline Phos 38 - 126 U/L  60  65   AST 15 - 41 U/L  13  13   ALT 0 - 44 U/L  11  13     Lab Results  Component Value Date   WBC 6.1 09/12/2022   HGB 14.7 09/12/2022   HCT 44.7 09/12/2022   MCV 89.8 09/12/2022   PLT 223 09/12/2022   NEUTROABS 4.2 09/12/2022    ASSESSMENT & PLAN:  Breast cancer of lower-inner quadrant of right female breast ([place]) Metastatic breast cancer MRI right hip 10/23/2020: Stress fracture and suspected early avascular necrosis versus metastatic cancer.  Patchy marrow edema in the right femoral head neck and intertrochanteric region.   Bone scan 11/04/2020: Suspected metastatic lesions involving lateral right third rib and posterior right fourth rib.  Increased tracer uptake right femoral head and neck and intertrochanteric region corresponding to stress fracture and early AVN   11/18/20: CT CAP: Mildly enl Rt Axill LN 1.2 cm, Mixed lytic and sclerotic lesion Rt femoral head, Rt 3rd rib, Rt T4 transverse process, COPD   Rt Fem Head resection: Met breast cancer ER Pos, PR Pos, Her 2 Neg -------------------------------------------------------------------------------------------------------------------------------------------- Current treatment:  letrozole (Ibrance will be started if the scans show progression) Bone metastases: Xgeva injection along with calcium and vitamin D started 09/11/2021.   09/08/2021: CT CAP and bone scan: Unchanged bone metastases T4, right third rib, decrease in right axillary lymph node   CT CAP  06/06/2022: Stable right axillary lymph node.  No evidence of new or progressive disease in chest abdomen pelvis   Xgeva related bone pain:  Discontinued Xgeva, she is doing significantly better.   Return to clinic in 3 months with labs, scans and follow-up    Orders Placed This Encounter  Procedures   CT CHEST ABDOMEN PELVIS W CONTRAST    Standing Status:   Future    Standing Expiration Date:   09/13/2023    Order Specific Question:   If indicated for the ordered procedure, I authorize the administration of contrast media per Radiology protocol    Answer:   Yes    Order Specific Question:   Does the patient have a contrast media/X-ray dye allergy?    Answer:   No    Order Specific Question:   Preferred imaging location?    Answer:   [facility]    Order Specific Question:   Is  Oral Contrast requested for this exam?    Answer:   Yes, Per Radiology protocol   The patient has a good understanding of the overall plan. she agrees with it. she will call with any problems that may develop before the next visit here. Total time spent: 30 mins including face to face time and time spent for planning, charting and co-ordination of care   Jessica Ohara, MD 09/12/22    I Gardiner Coins am scribing for Dr. Lindi [name]  I have reviewed the above documentation for accuracy and completeness, and I agree with the above.

## 2022-09-11 ENCOUNTER — Other Ambulatory Visit: Payer: Self-pay | Admitting: *Deleted

## 2022-09-11 DIAGNOSIS — Z170 Estrogen receptor positive status [ER+]: Secondary | ICD-10-CM

## 2022-09-12 ENCOUNTER — Inpatient Hospital Stay: Payer: Medicare Other | Attending: Hematology and Oncology

## 2022-09-12 ENCOUNTER — Inpatient Hospital Stay: Payer: Medicare Other | Admitting: Hematology and Oncology

## 2022-09-12 ENCOUNTER — Other Ambulatory Visit: Payer: Self-pay

## 2022-09-12 VITALS — BP 153/74 | HR 80 | Temp 97.7°F | Resp 18 | Ht 62.0 in | Wt 155.4 lb

## 2022-09-12 DIAGNOSIS — C50311 Malignant neoplasm of lower-inner quadrant of right female breast: Secondary | ICD-10-CM | POA: Diagnosis not present

## 2022-09-12 DIAGNOSIS — Z170 Estrogen receptor positive status [ER+]: Secondary | ICD-10-CM

## 2022-09-12 DIAGNOSIS — Z79811 Long term (current) use of aromatase inhibitors: Secondary | ICD-10-CM | POA: Insufficient documentation

## 2022-09-12 DIAGNOSIS — C7951 Secondary malignant neoplasm of bone: Secondary | ICD-10-CM | POA: Insufficient documentation

## 2022-09-12 LAB — CMP (CANCER CENTER ONLY)
ALT: 14 U/L (ref 0–44)
AST: 16 U/L (ref 15–41)
Albumin: 4.1 g/dL (ref 3.5–5.0)
Alkaline Phosphatase: 89 U/L (ref 38–126)
Anion gap: 4 — ABNORMAL LOW (ref 5–15)
BUN: 21 mg/dL (ref 8–23)
CO2: 32 mmol/L (ref 22–32)
Calcium: 9.6 mg/dL (ref 8.9–10.3)
Chloride: 104 mmol/L (ref 98–111)
Creatinine: 0.86 mg/dL (ref 0.44–1.00)
GFR, Estimated: >60 mL/min (ref >60)
Glucose, Bld: 115 mg/dL — ABNORMAL HIGH (ref 70–99)
Potassium: 4.9 mmol/L (ref 3.5–5.1)
Sodium: 140 mmol/L (ref 135–145)
Total Bilirubin: 0.4 mg/dL (ref 0.3–1.2)
Total Protein: 7.8 g/dL (ref 6.5–8.1)

## 2022-09-12 LAB — CBC WITH DIFFERENTIAL (CANCER CENTER ONLY)
Abs Immature Granulocytes: 0.07 10*3/uL (ref 0.00–0.07)
Basophils Absolute: 0.1 10*3/uL (ref 0.0–0.1)
Basophils Relative: 1 %
Eosinophils Absolute: 0.1 10*3/uL (ref 0.0–0.5)
Eosinophils Relative: 2 %
HCT: 44.7 % (ref 36.0–46.0)
Hemoglobin: 14.7 g/dL (ref 12.0–15.0)
Immature Granulocytes: 1 %
Lymphocytes Relative: 20 %
Lymphs Abs: 1.2 10*3/uL (ref 0.7–4.0)
MCH: 29.5 pg (ref 26.0–34.0)
MCHC: 32.9 g/dL (ref 30.0–36.0)
MCV: 89.8 fL (ref 80.0–100.0)
Monocytes Absolute: 0.5 10*3/uL (ref 0.1–1.0)
Monocytes Relative: 7 %
Neutro Abs: 4.2 10*3/uL (ref 1.7–7.7)
Neutrophils Relative %: 69 %
Platelet Count: 223 10*3/uL (ref 150–400)
RBC: 4.98 MIL/uL (ref 3.87–5.11)
RDW: 12.8 % (ref 11.5–15.5)
WBC Count: 6.1 10*3/uL (ref 4.0–10.5)
nRBC: 0 % (ref 0.0–0.2)

## 2022-09-12 NOTE — Assessment & Plan Note (Addendum)
Metastatic breast cancer MRI right hip 10/23/2020: Stress fracture and suspected early avascular necrosis versus metastatic cancer.  Patchy marrow edema in the right femoral head neck and intertrochanteric region.   Bone scan 11/04/2020: Suspected metastatic lesions involving lateral right third rib and posterior right fourth rib.  Increased tracer uptake right femoral head and neck and intertrochanteric region corresponding to stress fracture and early AVN   11/18/20: CT CAP: Mildly enl Rt Axill LN 1.2 cm, Mixed lytic and sclerotic lesion Rt femoral head, Rt 3rd rib, Rt T4 transverse process, COPD   Rt Fem Head resection: Met breast cancer ER Pos, PR Pos, Her 2 Neg -------------------------------------------------------------------------------------------------------------------------------------------- Current treatment:  letrozole (Ibrance will be started if the scans show progression) Bone metastases: Xgeva injection along with calcium and vitamin D started 09/11/2021.   09/08/2021: CT CAP and bone scan: Unchanged bone metastases T4, right third rib, decrease in right axillary lymph node   CT CAP 06/06/2022: Stable right axillary lymph node.  No evidence of new or progressive disease in chest abdomen pelvis   Xgeva related bone pain:  Discontinued Xgeva   Return to clinic in 3 months with labs, scans and follow-up

## 2022-11-28 ENCOUNTER — Other Ambulatory Visit: Payer: Self-pay | Admitting: Hematology and Oncology

## 2022-12-13 ENCOUNTER — Ambulatory Visit (HOSPITAL_COMMUNITY)
Admission: RE | Admit: 2022-12-13 | Discharge: 2022-12-13 | Disposition: A | Discharge status: Home / Self Care | Payer: Medicare Other | Source: Ambulatory Visit | Attending: Hematology and Oncology | Admitting: Hematology and Oncology

## 2022-12-13 ENCOUNTER — Encounter (HOSPITAL_COMMUNITY): Payer: Self-pay

## 2022-12-13 DIAGNOSIS — Z170 Estrogen receptor positive status [ER+]: Secondary | ICD-10-CM | POA: Diagnosis not present

## 2022-12-13 DIAGNOSIS — J432 Centrilobular emphysema: Secondary | ICD-10-CM | POA: Diagnosis not present

## 2022-12-13 DIAGNOSIS — K760 Fatty (change of) liver, not elsewhere classified: Secondary | ICD-10-CM | POA: Diagnosis not present

## 2022-12-13 DIAGNOSIS — C50919 Malignant neoplasm of unspecified site of unspecified female breast: Secondary | ICD-10-CM | POA: Diagnosis not present

## 2022-12-13 DIAGNOSIS — C50311 Malignant neoplasm of lower-inner quadrant of right female breast: Secondary | ICD-10-CM | POA: Insufficient documentation

## 2022-12-13 LAB — POCT I-STAT CREATININE: Creatinine, Ser: 0.9 mg/dL (ref 0.44–1.00)

## 2022-12-13 MED ORDER — IOHEXOL 9 MG/ML PO SOLN
ORAL | Status: AC
  Filled 2022-12-13: qty 1000

## 2022-12-13 MED ORDER — IOHEXOL 300 MG/ML  SOLN
100.0000 mL | Freq: Once | INTRAMUSCULAR | Status: AC | PRN
  Administered 2022-12-13: 100 mL via INTRAVENOUS

## 2022-12-13 MED ORDER — SODIUM CHLORIDE (PF) 0.9 % IJ SOLN
INTRAMUSCULAR | Status: AC
  Filled 2022-12-13: qty 50

## 2022-12-13 MED ORDER — IOHEXOL 9 MG/ML PO SOLN: 500.0000 mL | ORAL | Status: AC

## 2022-12-13 NOTE — Progress Notes (Signed)
Patient Care Team: System, Provider Not In as PCP - General Magrinat, [name], MD (Inactive) as Consulting Physician (Oncology) [name], MD (Inactive) as Consulting Physician (Dermatology)  DIAGNOSIS: No diagnosis found.  SUMMARY OF ONCOLOGIC HISTORY: Oncology History  Breast cancer of lower-inner quadrant of right female breast ([place])  01/16/2016 Initial Biopsy   Skin biopsy done by Dr. Denna Haggard found metastatic carcinoma ER/PR +.   02/02/2016 Initial Diagnosis   Breast cancer of lower-inner quadrant of right female breast ([place])   02/02/2016 - 04/10/2016 Neo-Adjuvant Anti-estrogen oral therapy   Anastrozole '1mg'$  daily.   02/10/2016 Procedure   CT chest negative for metastatic disease Bone scan negative for metastatic disease   02/14/2016 Mammogram   1.7cm irregular mass in right lower inner quadrant corresponding with known breast cancer skin biopsy.   02/16/2016 Breast MRI   1. Biopsy proven cancer in lower inner quadrant of right breast: 2.8x1.7x1cm, extending to skin surface.  No other evidence of disease in right breast, left breast, or abnormal lymph nodes within the axillary or internal mammary chain regions.   2. Outside ultrasound report recommended additional ultrasound guided biopsy for hypoechoic mass in right breast at 9 o'clock suspected to be intramammary node.     02/22/2016 Procedure   Biopsy of 9 o'clock mass: complex sclerosing lesion with atypical ductal hyperplasia.     03/20/2016 Oncotype testing   Declined oncotype testing   03/20/2016 Surgery   1. Right lumpectomy (9 o'clock lesion): atypical ductal hyperplasia 2. Right partial mastectomy: IDC, grade 1, ER+(100%), PR(15%), Ki-67 5%, HER-2 neg (ratio 1.40), 2.5 cm, with atypical ductal hyperplasia, lymphovascular invasion, margins negative.   04/05/2016 -  Radiation Therapy   Declined radiation therapy   07/17/2016 -  Anti-estrogen oral therapy   Started Tamoxifen '20mg'$  daily.  A total of 5 years of therapy  planned.   10/23/2020 Relapse/Recurrence   Patient has persistent right hip pain. MRI on 10/23/20 after continued pain showed non mass-like hypointensity involving the right femoral head, neck, and intertrochanteric region possibly representing metastasis with superimposed pathologic stress fracture. Bone scan on 11/04/20 showed suspected lesions involving the lateral right third rib and the posterior right fourth rib.    11/22/2020 Surgery   Right hip replacement Ninfa Linden): ER+ metastatic carcinoma consistent with breast origin.    12/30/2020 -  Radiation Therapy   Palliative radiation to right femoral head      CHIEF COMPLIANT:  Follow-up of metastatic breast cancer on Letrozole    INTERVAL HISTORY: Jessica [name] is a 88 y.o. with above-mentioned history of metastatic breast cancer currently on treatment with palliative radiation and letrozole. She presents to the clinic today for follow-up to discuss labs and scans   ALLERGIES:  is allergic to codeine.  MEDICATIONS:  Current Outpatient Medications  Medication Sig Dispense Refill   ibuprofen (ADVIL,MOTRIN) 200 MG tablet Take 400 mg by mouth every 6 (six) hours as needed for moderate pain.     letrozole (FEMARA) 2.5 MG tablet TAKE 1 TABLET BY MOUTH DAILY. 90 tablet 3   No current facility-administered medications for this visit.   Facility-Administered Medications Ordered in Other Visits  Medication Dose Route Frequency Provider Last Rate Last Admin   iohexol (OMNIPAQUE) 9 MG/ML oral solution 500 mL  500 mL Oral Q1H Nicholas Lose, MD       iohexol (OMNIPAQUE) 9 MG/ML oral solution             PHYSICAL EXAMINATION: ECOG PERFORMANCE STATUS: {CHL ONC ECOG  [id number]  There were no vitals filed for this visit. There were no vitals filed for this visit.  BREAST:*** No palpable masses or nodules in either right or left breasts. No palpable axillary supraclavicular or infraclavicular adenopathy no breast tenderness or nipple  discharge. (exam performed in the presence of a chaperone)  LABORATORY DATA:  I have reviewed the data as listed    Latest Ref Rng & Units 09/12/2022   10:07 AM 06/05/2022    8:57 AM 03/06/2022   10:01 AM  CMP  Glucose 70 - 99 mg/dL 115   104   BUN 8 - 23 mg/dL 21   23   Creatinine 0.44 - 1.00 mg/dL 0.86  1.00  0.94   Sodium 135 - 145 mmol/L 140   139   Potassium 3.5 - 5.1 mmol/L 4.9   5.1   Chloride 98 - 111 mmol/L 104   102   CO2 22 - 32 mmol/L 32   33   Calcium 8.9 - 10.3 mg/dL 9.6   9.2   Total Protein 6.5 - 8.1 g/dL 7.8   7.6   Total Bilirubin 0.3 - 1.2 mg/dL 0.4   0.4   Alkaline Phos 38 - 126 U/L 89   60   AST 15 - 41 U/L 16   13   ALT 0 - 44 U/L 14   11     Lab Results  Component Value Date   WBC 6.1 09/12/2022   HGB 14.7 09/12/2022   HCT 44.7 09/12/2022   MCV 89.8 09/12/2022   PLT 223 09/12/2022   NEUTROABS 4.2 09/12/2022    ASSESSMENT & PLAN:  No problem-specific Assessment & Plan notes found for this encounter.    No orders of the defined types were placed in this encounter.  The patient has a good understanding of the overall plan. she agrees with it. she will call with any problems that may develop before the next visit here. Total time spent: 30 mins including face to face time and time spent for planning, charting and co-ordination of care   Suzzette Righter, [place] 12/13/22    I Gardiner Coins am acting as a Education administrator for [employer]  ***

## 2022-12-17 ENCOUNTER — Other Ambulatory Visit: Payer: Self-pay

## 2022-12-17 ENCOUNTER — Inpatient Hospital Stay: Payer: Medicare Other | Attending: Hematology and Oncology | Admitting: Hematology and Oncology

## 2022-12-17 VITALS — BP 144/79 | HR 87 | Temp 98.2°F | Resp 18 | Wt 156.5 lb

## 2022-12-17 DIAGNOSIS — C50311 Malignant neoplasm of lower-inner quadrant of right female breast: Secondary | ICD-10-CM | POA: Insufficient documentation

## 2022-12-17 DIAGNOSIS — Z170 Estrogen receptor positive status [ER+]: Secondary | ICD-10-CM | POA: Insufficient documentation

## 2022-12-17 DIAGNOSIS — Z79811 Long term (current) use of aromatase inhibitors: Secondary | ICD-10-CM | POA: Insufficient documentation

## 2022-12-17 DIAGNOSIS — M898X9 Other specified disorders of bone, unspecified site: Secondary | ICD-10-CM | POA: Insufficient documentation

## 2022-12-17 NOTE — Assessment & Plan Note (Addendum)
Metastatic breast cancer MRI right hip 10/23/2020: Stress fracture and suspected early avascular necrosis versus metastatic cancer.  Patchy marrow edema in the right femoral head neck and intertrochanteric region.   Bone scan 11/04/2020: Suspected metastatic lesions involving lateral right third rib and posterior right fourth rib.  Increased tracer uptake right femoral head and neck and intertrochanteric region corresponding to stress fracture and early AVN   11/18/20: CT CAP: Mildly enl Rt Axill LN 1.2 cm, Mixed lytic and sclerotic lesion Rt femoral head, Rt 3rd rib, Rt T4 transverse process, COPD   Rt Fem Head resection: Met breast cancer ER Pos, PR Pos, Her 2 Neg -------------------------------------------------------------------------------------------------------------------------------------------- Current treatment:  letrozole (Ibrance will be started if the scans show progression) Bone metastases: Xgeva injection along with calcium and vitamin D started 09/11/2021.   09/08/2021: CT CAP and bone scan: Unchanged bone metastases T4, right third rib, decrease in right axillary lymph node  CT CAP 06/06/2022: Stable right axillary lymph node.  No evidence of new or progressive disease in chest abdomen pelvis CT CAP 12/14/2022: No significant interval change.  Stable bone sclerosis T4, right third rib, left femoral neck, emphysema, mixed cystic and solid left lower lobe lung nodule 1 cm (stable)  Xgeva related bone pain:  Discontinued Xgeva, she is doing significantly better.   Return to clinic in 1 year with labs, scans and follow-up

## 2023-11-18 ENCOUNTER — Other Ambulatory Visit: Payer: Self-pay | Admitting: Hematology and Oncology

## 2023-12-18 ENCOUNTER — Other Ambulatory Visit: Payer: Self-pay | Admitting: *Deleted

## 2023-12-18 DIAGNOSIS — C50311 Malignant neoplasm of lower-inner quadrant of right female breast: Secondary | ICD-10-CM

## 2023-12-18 NOTE — Progress Notes (Signed)
 Per MD request, orders placed for yearly CT CAP.

## 2023-12-23 ENCOUNTER — Ambulatory Visit (HOSPITAL_COMMUNITY)
Admission: RE | Admit: 2023-12-23 | Discharge: 2023-12-23 | Disposition: A | Discharge status: Home / Self Care | Source: Ambulatory Visit | Attending: Hematology and Oncology | Admitting: Hematology and Oncology

## 2023-12-23 ENCOUNTER — Encounter (HOSPITAL_COMMUNITY): Payer: Self-pay

## 2023-12-23 ENCOUNTER — Telehealth: Payer: Self-pay

## 2023-12-23 DIAGNOSIS — C50311 Malignant neoplasm of lower-inner quadrant of right female breast: Secondary | ICD-10-CM | POA: Insufficient documentation

## 2023-12-23 DIAGNOSIS — Z170 Estrogen receptor positive status [ER+]: Secondary | ICD-10-CM | POA: Insufficient documentation

## 2023-12-23 DIAGNOSIS — K5750 Diverticulosis of both small and large intestine without perforation or abscess without bleeding: Secondary | ICD-10-CM | POA: Diagnosis not present

## 2023-12-23 DIAGNOSIS — R590 Localized enlarged lymph nodes: Secondary | ICD-10-CM | POA: Diagnosis not present

## 2023-12-23 MED ORDER — IOHEXOL 300 MG/ML  SOLN
100.0000 mL | Freq: Once | INTRAMUSCULAR | Status: AC | PRN
  Administered 2023-12-23: 100 mL via INTRAVENOUS

## 2023-12-23 NOTE — Telephone Encounter (Signed)
 Contacted [facility] reading room for STAT read on CT CAP. S/w Gabe who will escalate this request.

## 2023-12-24 ENCOUNTER — Inpatient Hospital Stay: Payer: Medicare Other | Attending: Hematology and Oncology | Admitting: Hematology and Oncology

## 2023-12-24 VITALS — BP 147/72 | HR 85 | Temp 97.8°F | Resp 18 | Ht 62.0 in | Wt 156.6 lb

## 2023-12-24 DIAGNOSIS — I7090 Unspecified atherosclerosis: Secondary | ICD-10-CM | POA: Insufficient documentation

## 2023-12-24 DIAGNOSIS — J439 Emphysema, unspecified: Secondary | ICD-10-CM | POA: Insufficient documentation

## 2023-12-24 DIAGNOSIS — C50311 Malignant neoplasm of lower-inner quadrant of right female breast: Secondary | ICD-10-CM | POA: Insufficient documentation

## 2023-12-24 DIAGNOSIS — Z1721 Progesterone receptor positive status: Secondary | ICD-10-CM | POA: Diagnosis not present

## 2023-12-24 DIAGNOSIS — Z79811 Long term (current) use of aromatase inhibitors: Secondary | ICD-10-CM | POA: Diagnosis not present

## 2023-12-24 DIAGNOSIS — C7951 Secondary malignant neoplasm of bone: Secondary | ICD-10-CM | POA: Insufficient documentation

## 2023-12-24 DIAGNOSIS — Z170 Estrogen receptor positive status [ER+]: Secondary | ICD-10-CM | POA: Diagnosis not present

## 2023-12-24 DIAGNOSIS — Z923 Personal history of irradiation: Secondary | ICD-10-CM | POA: Diagnosis not present

## 2023-12-24 DIAGNOSIS — Z1732 Human epidermal growth factor receptor 2 negative status: Secondary | ICD-10-CM | POA: Insufficient documentation

## 2023-12-24 NOTE — Assessment & Plan Note (Signed)
 Metastatic breast cancer MRI right hip 10/23/2020: Stress fracture and suspected early avascular necrosis versus metastatic cancer.  Patchy marrow edema in the right femoral head neck and intertrochanteric region.   Bone scan 11/04/2020: Suspected metastatic lesions involving lateral right third rib and posterior right fourth rib.  Increased tracer uptake right femoral head and neck and intertrochanteric region corresponding to stress fracture and early AVN   11/18/20: CT CAP: Mildly enl Rt Axill LN 1.2 cm, Mixed lytic and sclerotic lesion Rt femoral head, Rt 3rd rib, Rt T4 transverse process, COPD   Rt Fem Head resection: Met breast cancer ER Pos, PR Pos, Her 2 Neg -------------------------------------------------------------------------------------------------------------------------------------------- Current treatment:  letrozole (Ibrance will be started if the scans show progression) Bone metastases: Xgeva injection along with calcium and vitamin D started 09/11/2021.   09/08/2021: CT CAP and bone scan: Unchanged bone metastases T4, right third rib, decrease in right axillary lymph node  CT CAP 06/06/2022: Stable right axillary lymph node.  No evidence of new or progressive disease in chest abdomen pelvis CT CAP 12/14/2022: No significant interval change.  Stable bone sclerosis T4, right third rib, left femoral neck, emphysema, mixed cystic and solid left lower lobe lung nodule 1 cm (stable) CT CAP 12/23/2023: No significant interval change in right axillary lymph node, unchanged bone metastases T4, right third rib, left femoral neck, left lower lobe nodule 1.2 cm.   Xgeva related bone pain:  Discontinued Xgeva, she is doing significantly better.   Return to clinic in 1 year with labs, scans and follow-up

## 2023-12-24 NOTE — Progress Notes (Signed)
 Patient Care Team: System, Provider Not In as PCP - General Magrinat, Valentino Hue, MD (Inactive) as Consulting Physician (Oncology) Janalyn Harder, MD (Inactive) as Consulting Physician (Dermatology)  DIAGNOSIS:  Encounter Diagnosis  Name Primary?   Malignant neoplasm of lower-inner quadrant of right breast of female, estrogen receptor positive (HCC) Yes    SUMMARY OF ONCOLOGIC HISTORY: Oncology History  Breast cancer of lower-inner quadrant of right female breast (HCC)  01/16/2016 Initial Biopsy   Skin biopsy done by Dr. Jorja Loa found metastatic carcinoma ER/PR +.   02/02/2016 Initial Diagnosis   Breast cancer of lower-inner quadrant of right female breast (HCC)   02/02/2016 - 04/10/2016 Neo-Adjuvant Anti-estrogen oral therapy   Anastrozole 1mg  daily.   02/10/2016 Procedure   CT chest negative for metastatic disease Bone scan negative for metastatic disease   02/14/2016 Mammogram   1.7cm irregular mass in right lower inner quadrant corresponding with known breast cancer skin biopsy.   02/16/2016 Breast MRI   1. Biopsy proven cancer in lower inner quadrant of right breast: 2.8x1.7x1cm, extending to skin surface.  No other evidence of disease in right breast, left breast, or abnormal lymph nodes within the axillary or internal mammary chain regions.   2. Outside ultrasound report recommended additional ultrasound guided biopsy for hypoechoic mass in right breast at 9 o'clock suspected to be intramammary node.     02/22/2016 Procedure   Biopsy of 9 o'clock mass: complex sclerosing lesion with atypical ductal hyperplasia.     03/20/2016 Oncotype testing   Declined oncotype testing   03/20/2016 Surgery   1. Right lumpectomy (9 o'clock lesion): atypical ductal hyperplasia 2. Right partial mastectomy: IDC, grade 1, ER+(100%), PR(15%), Ki-67 5%, HER-2 neg (ratio 1.40), 2.5 cm, with atypical ductal hyperplasia, lymphovascular invasion, margins negative.   04/05/2016 -  Radiation Therapy    Declined radiation therapy   07/17/2016 -  Anti-estrogen oral therapy   Started Tamoxifen 20mg  daily.  A total of 5 years of therapy planned.   10/23/2020 Relapse/Recurrence   Patient has persistent right hip pain. MRI on 10/23/20 after continued pain showed non mass-like hypointensity involving the right femoral head, neck, and intertrochanteric region possibly representing metastasis with superimposed pathologic stress fracture. Bone scan on 11/04/20 showed suspected lesions involving the lateral right third rib and the posterior right fourth rib.    11/22/2020 Surgery   Right hip replacement Magnus Ivan): ER+ metastatic carcinoma consistent with breast origin.    12/30/2020 -  Radiation Therapy   Palliative radiation to right femoral head      CHIEF COMPLIANT: Follow-up to review the results of scans for metastatic breast cancer  HISTORY OF PRESENT ILLNESS:   History of Present Illness The patient, with a history of cancer with metastasis to the bones and lymph nodes, presents for a follow-up visit to review recent CT scan results. She reports no new symptoms or changes in her condition. The patient has been managing her condition with letrozole, which she tolerates well. She also has a history of emphysema and arterial hardening, particularly in the heart. The patient's blood pressure has been noted to be slightly elevated, but she does not report any symptoms related to this.     ALLERGIES:  is allergic to codeine.  MEDICATIONS:  Current Outpatient Medications  Medication Sig Dispense Refill   ibuprofen (ADVIL,MOTRIN) 200 MG tablet Take 400 mg by mouth every 6 (six) hours as needed for moderate pain.     letrozole (FEMARA) 2.5 MG tablet TAKE 1 TABLET BY  MOUTH DAILY. 90 tablet 4   No current facility-administered medications for this visit.    PHYSICAL EXAMINATION: ECOG PERFORMANCE STATUS: 1 - Symptomatic but completely ambulatory  Vitals:   12/24/23 0945  BP: (!) 147/72  Pulse: 85   Resp: 18  Temp: 97.8 F (36.6 C)  SpO2: 95%   Filed Weights   12/24/23 0945  Weight: 156 lb 9.6 oz (71 kg)      LABORATORY DATA:  I have reviewed the data as listed    Latest Ref Rng & Units 12/13/2022    4:00 PM 09/12/2022   10:07 AM 06/05/2022    8:57 AM  CMP  Glucose 70 - 99 mg/dL  629    BUN 8 - 23 mg/dL  21    Creatinine 5.28 - 1.00 mg/dL 4.13  2.44  0.10   Sodium 135 - 145 mmol/L  140    Potassium 3.5 - 5.1 mmol/L  4.9    Chloride 98 - 111 mmol/L  104    CO2 22 - 32 mmol/L  32    Calcium 8.9 - 10.3 mg/dL  9.6    Total Protein 6.5 - 8.1 g/dL  7.8    Total Bilirubin 0.3 - 1.2 mg/dL  0.4    Alkaline Phos 38 - 126 U/L  89    AST 15 - 41 U/L  16    ALT 0 - 44 U/L  14      Lab Results  Component Value Date   WBC 6.1 09/12/2022   HGB 14.7 09/12/2022   HCT 44.7 09/12/2022   MCV 89.8 09/12/2022   PLT 223 09/12/2022   NEUTROABS 4.2 09/12/2022    ASSESSMENT & PLAN:  Breast cancer of lower-inner quadrant of right female breast (HCC) Metastatic breast cancer MRI right hip 10/23/2020: Stress fracture and suspected early avascular necrosis versus metastatic cancer.  Patchy marrow edema in the right femoral head neck and intertrochanteric region.   Bone scan 11/04/2020: Suspected metastatic lesions involving lateral right third rib and posterior right fourth rib.  Increased tracer uptake right femoral head and neck and intertrochanteric region corresponding to stress fracture and early AVN   11/18/20: CT CAP: Mildly enl Rt Axill LN 1.2 cm, Mixed lytic and sclerotic lesion Rt femoral head, Rt 3rd rib, Rt T4 transverse process, COPD   Rt Fem Head resection: Met breast cancer ER Pos, PR Pos, Her 2 Neg -------------------------------------------------------------------------------------------------------------------------------------------- Current treatment:  letrozole (Ibrance will be started if the scans show progression) Bone metastases: Xgeva injection along with calcium and  vitamin D started 09/11/2021.   09/08/2021: CT CAP and bone scan: Unchanged bone metastases T4, right third rib, decrease in right axillary lymph node  CT CAP 06/06/2022: Stable right axillary lymph node.  No evidence of new or progressive disease in chest abdomen pelvis CT CAP 12/14/2022: No significant interval change.  Stable bone sclerosis T4, right third rib, left femoral neck, emphysema, mixed cystic and solid left lower lobe lung nodule 1 cm (stable) CT CAP 12/23/2023: No significant interval change in right axillary lymph node, unchanged bone metastases T4, right third rib, left femoral neck, left lower lobe nodule 1.2 cm.   Xgeva related bone pain:  Discontinued Xgeva, she is doing significantly better.   Return to clinic in 1 year with labs, scans and follow-up ------------------------------------- Assessment and Plan Assessment & Plan Metastatic breast cancer CT scans show stable disease. Lymph node and bone lesions unchanged. Lung cyst likely benign. Letrozole well-tolerated. - Continue letrozole 2.5  mg orally daily. - Schedule annual follow-up with CT scan prior to the appointment. - Advise to report any new or different symptoms immediately.  Chronic obstructive pulmonary disease (COPD) Emphysema well-managed with no significant changes on CT scan. - Encourage a healthy diet and regular walking.  Atherosclerosis CT scan indicates arterial hardening, particularly in the heart. - Advise a healthy diet and regular exercise.      Orders Placed This Encounter  Procedures   CT CHEST ABDOMEN PELVIS W CONTRAST    Standing Status:   Future    Expected Date:   12/14/2024    Expiration Date:   12/23/2024    If indicated for the ordered procedure, I authorize the administration of contrast media per Radiology protocol:   Yes    Does the patient have a contrast media/X-ray dye allergy?:   No    Preferred imaging location?:   [facility]    Release to patient:   Immediate     If indicated for the ordered procedure, I authorize the administration of oral contrast media per Radiology protocol:   Yes   The patient has a good understanding of the overall plan. she agrees with it. she will call with any problems that may develop before the next visit here. Total time spent: 30 mins including face to face time and time spent for planning, charting and co-ordination of care   Tamsen Meek, MD 12/24/23

## 2024-11-20 ENCOUNTER — Other Ambulatory Visit: Payer: Self-pay | Admitting: Hematology and Oncology

## 2024-12-15 ENCOUNTER — Ambulatory Visit (HOSPITAL_COMMUNITY)

## 2024-12-24 ENCOUNTER — Ambulatory Visit: Admitting: Hematology and Oncology
